# Patient Record
Sex: Female | Born: 1955
Health system: Southern US, Community
[De-identification: ages and names within clinical notes are randomized; demographics above are authoritative.]

## PROBLEM LIST (undated history)

## (undated) DIAGNOSIS — M810 Age-related osteoporosis without current pathological fracture: Secondary | ICD-10-CM

## (undated) DIAGNOSIS — G809 Cerebral palsy, unspecified: Secondary | ICD-10-CM

## (undated) DIAGNOSIS — R1310 Dysphagia, unspecified: Secondary | ICD-10-CM

## (undated) DIAGNOSIS — E785 Hyperlipidemia, unspecified: Secondary | ICD-10-CM

## (undated) HISTORY — PX: BREAST BIOPSY: SHX20

## (undated) HISTORY — DX: Dysphagia, unspecified: R13.10

## (undated) HISTORY — PX: WISDOM TOOTH EXTRACTION: SHX21

## (undated) HISTORY — PX: HIP SURGERY: SHX245

## (undated) HISTORY — DX: Cerebral palsy, unspecified: G80.9

## (undated) HISTORY — DX: Hyperlipidemia, unspecified: E78.5

## (undated) HISTORY — PX: MASTECTOMY: SHX3

## (undated) HISTORY — DX: Age-related osteoporosis without current pathological fracture: M81.0

## (undated) HISTORY — PX: SENTINEL LYMPH NODE BIOPSY: SHX2392

## (undated) HISTORY — PX: COLON RESECTION: SHX5231

---

## 1999-10-08 ENCOUNTER — Encounter: Admission: RE | Admit: 1999-10-08 | Discharge: 1999-10-08 | Payer: Self-pay | Admitting: Family Medicine

## 1999-10-08 ENCOUNTER — Encounter: Payer: Self-pay | Admitting: Family Medicine

## 1999-10-18 ENCOUNTER — Encounter: Payer: Self-pay | Admitting: Family Medicine

## 1999-10-18 ENCOUNTER — Encounter: Admission: RE | Admit: 1999-10-18 | Discharge: 1999-10-18 | Payer: Self-pay | Admitting: Family Medicine

## 2001-06-04 ENCOUNTER — Encounter: Payer: Self-pay | Admitting: Family Medicine

## 2001-06-04 ENCOUNTER — Encounter: Admission: RE | Admit: 2001-06-04 | Discharge: 2001-06-04 | Payer: Self-pay | Admitting: Family Medicine

## 2002-09-08 ENCOUNTER — Encounter: Admission: RE | Admit: 2002-09-08 | Discharge: 2002-09-08 | Payer: Self-pay | Admitting: Family Medicine

## 2002-09-08 ENCOUNTER — Encounter: Payer: Self-pay | Admitting: Family Medicine

## 2003-10-23 ENCOUNTER — Encounter: Admission: RE | Admit: 2003-10-23 | Discharge: 2003-10-23 | Payer: Self-pay | Admitting: Family Medicine

## 2005-02-13 ENCOUNTER — Encounter: Admission: RE | Admit: 2005-02-13 | Discharge: 2005-02-13 | Payer: Self-pay | Admitting: Family Medicine

## 2006-03-05 ENCOUNTER — Encounter: Admission: RE | Admit: 2006-03-05 | Discharge: 2006-03-05 | Payer: Self-pay | Admitting: Family Medicine

## 2007-03-24 ENCOUNTER — Other Ambulatory Visit: Admission: RE | Admit: 2007-03-24 | Discharge: 2007-03-24 | Payer: Self-pay | Admitting: Obstetrics and Gynecology

## 2007-03-25 ENCOUNTER — Encounter: Admission: RE | Admit: 2007-03-25 | Discharge: 2007-03-25 | Payer: Self-pay | Admitting: Family Medicine

## 2008-07-13 ENCOUNTER — Encounter: Admission: RE | Admit: 2008-07-13 | Discharge: 2008-07-13 | Payer: Self-pay | Admitting: Family Medicine

## 2009-08-24 ENCOUNTER — Encounter: Admission: RE | Admit: 2009-08-24 | Discharge: 2009-08-24 | Payer: Self-pay | Admitting: Internal Medicine

## 2009-10-15 ENCOUNTER — Encounter: Admission: RE | Admit: 2009-10-15 | Discharge: 2009-10-15 | Payer: Self-pay | Admitting: Family Medicine

## 2009-11-13 ENCOUNTER — Other Ambulatory Visit
Admission: RE | Admit: 2009-11-13 | Discharge: 2009-11-13 | Payer: Self-pay | Source: Home / Self Care | Admitting: Obstetrics and Gynecology

## 2010-09-11 ENCOUNTER — Other Ambulatory Visit: Payer: Self-pay | Admitting: Internal Medicine

## 2010-09-11 DIAGNOSIS — Z1231 Encounter for screening mammogram for malignant neoplasm of breast: Secondary | ICD-10-CM

## 2010-10-17 ENCOUNTER — Ambulatory Visit: Payer: Self-pay

## 2010-10-31 ENCOUNTER — Ambulatory Visit
Admission: RE | Admit: 2010-10-31 | Discharge: 2010-10-31 | Disposition: A | Discharge status: Home / Self Care | Payer: Medicare Other | Source: Ambulatory Visit | Attending: Internal Medicine | Admitting: Internal Medicine

## 2010-10-31 DIAGNOSIS — Z1231 Encounter for screening mammogram for malignant neoplasm of breast: Secondary | ICD-10-CM

## 2011-07-25 DIAGNOSIS — G809 Cerebral palsy, unspecified: Secondary | ICD-10-CM | POA: Diagnosis not present

## 2011-07-25 DIAGNOSIS — Z0000 Encounter for general adult medical examination without abnormal findings: Secondary | ICD-10-CM | POA: Diagnosis not present

## 2011-07-25 DIAGNOSIS — R030 Elevated blood-pressure reading, without diagnosis of hypertension: Secondary | ICD-10-CM | POA: Diagnosis not present

## 2011-07-25 DIAGNOSIS — E782 Mixed hyperlipidemia: Secondary | ICD-10-CM | POA: Diagnosis not present

## 2011-10-10 DIAGNOSIS — Z23 Encounter for immunization: Secondary | ICD-10-CM | POA: Diagnosis not present

## 2012-01-05 ENCOUNTER — Other Ambulatory Visit: Payer: Self-pay | Admitting: Internal Medicine

## 2012-01-05 DIAGNOSIS — Z1231 Encounter for screening mammogram for malignant neoplasm of breast: Secondary | ICD-10-CM

## 2012-02-09 ENCOUNTER — Ambulatory Visit: Payer: Medicare Other

## 2012-03-11 ENCOUNTER — Ambulatory Visit: Payer: Medicare Other

## 2012-04-14 ENCOUNTER — Ambulatory Visit: Payer: Medicare Other

## 2012-04-15 ENCOUNTER — Ambulatory Visit
Admission: RE | Admit: 2012-04-15 | Discharge: 2012-04-15 | Disposition: A | Discharge status: Home / Self Care | Payer: Medicare Other | Source: Ambulatory Visit | Attending: Internal Medicine | Admitting: Internal Medicine

## 2012-04-15 DIAGNOSIS — Z1231 Encounter for screening mammogram for malignant neoplasm of breast: Secondary | ICD-10-CM

## 2012-05-17 DIAGNOSIS — M79609 Pain in unspecified limb: Secondary | ICD-10-CM | POA: Diagnosis not present

## 2012-05-17 DIAGNOSIS — H6120 Impacted cerumen, unspecified ear: Secondary | ICD-10-CM | POA: Diagnosis not present

## 2012-05-17 DIAGNOSIS — IMO0002 Reserved for concepts with insufficient information to code with codable children: Secondary | ICD-10-CM | POA: Diagnosis not present

## 2012-05-18 DIAGNOSIS — IMO0002 Reserved for concepts with insufficient information to code with codable children: Secondary | ICD-10-CM | POA: Diagnosis not present

## 2012-05-19 DIAGNOSIS — Y939 Activity, unspecified: Secondary | ICD-10-CM | POA: Diagnosis not present

## 2012-05-19 DIAGNOSIS — Y929 Unspecified place or not applicable: Secondary | ICD-10-CM | POA: Diagnosis not present

## 2012-05-19 DIAGNOSIS — Y999 Unspecified external cause status: Secondary | ICD-10-CM | POA: Diagnosis not present

## 2012-05-19 DIAGNOSIS — X58XXXA Exposure to other specified factors, initial encounter: Secondary | ICD-10-CM | POA: Diagnosis not present

## 2012-05-19 DIAGNOSIS — IMO0002 Reserved for concepts with insufficient information to code with codable children: Secondary | ICD-10-CM | POA: Diagnosis not present

## 2012-05-27 DIAGNOSIS — IMO0002 Reserved for concepts with insufficient information to code with codable children: Secondary | ICD-10-CM | POA: Diagnosis not present

## 2012-06-10 DIAGNOSIS — IMO0002 Reserved for concepts with insufficient information to code with codable children: Secondary | ICD-10-CM | POA: Diagnosis not present

## 2012-07-08 DIAGNOSIS — IMO0002 Reserved for concepts with insufficient information to code with codable children: Secondary | ICD-10-CM | POA: Diagnosis not present

## 2012-07-14 DIAGNOSIS — T8489XA Other specified complication of internal orthopedic prosthetic devices, implants and grafts, initial encounter: Secondary | ICD-10-CM | POA: Diagnosis not present

## 2012-07-14 DIAGNOSIS — IMO0002 Reserved for concepts with insufficient information to code with codable children: Secondary | ICD-10-CM | POA: Diagnosis not present

## 2012-07-14 DIAGNOSIS — Y831 Surgical operation with implant of artificial internal device as the cause of abnormal reaction of the patient, or of later complication, without mention of misadventure at the time of the procedure: Secondary | ICD-10-CM | POA: Diagnosis not present

## 2012-10-15 DIAGNOSIS — Z23 Encounter for immunization: Secondary | ICD-10-CM | POA: Diagnosis not present

## 2013-04-21 DIAGNOSIS — E782 Mixed hyperlipidemia: Secondary | ICD-10-CM | POA: Diagnosis not present

## 2013-04-21 DIAGNOSIS — G809 Cerebral palsy, unspecified: Secondary | ICD-10-CM | POA: Diagnosis not present

## 2013-04-21 DIAGNOSIS — Z1211 Encounter for screening for malignant neoplasm of colon: Secondary | ICD-10-CM | POA: Diagnosis not present

## 2013-04-21 DIAGNOSIS — Z0000 Encounter for general adult medical examination without abnormal findings: Secondary | ICD-10-CM | POA: Diagnosis not present

## 2013-07-04 ENCOUNTER — Other Ambulatory Visit: Payer: Self-pay

## 2013-07-04 DIAGNOSIS — Z1231 Encounter for screening mammogram for malignant neoplasm of breast: Secondary | ICD-10-CM

## 2013-07-14 ENCOUNTER — Ambulatory Visit
Admission: RE | Admit: 2013-07-14 | Discharge: 2013-07-14 | Disposition: A | Discharge status: Home / Self Care | Payer: Medicare Other | Source: Ambulatory Visit

## 2013-07-14 DIAGNOSIS — Z1231 Encounter for screening mammogram for malignant neoplasm of breast: Secondary | ICD-10-CM

## 2013-10-26 DIAGNOSIS — Z23 Encounter for immunization: Secondary | ICD-10-CM | POA: Diagnosis not present

## 2014-06-19 DIAGNOSIS — E782 Mixed hyperlipidemia: Secondary | ICD-10-CM | POA: Diagnosis not present

## 2014-06-19 DIAGNOSIS — G809 Cerebral palsy, unspecified: Secondary | ICD-10-CM | POA: Diagnosis not present

## 2014-06-19 DIAGNOSIS — R030 Elevated blood-pressure reading, without diagnosis of hypertension: Secondary | ICD-10-CM | POA: Diagnosis not present

## 2014-06-19 DIAGNOSIS — Z0001 Encounter for general adult medical examination with abnormal findings: Secondary | ICD-10-CM | POA: Diagnosis not present

## 2014-07-17 DIAGNOSIS — R05 Cough: Secondary | ICD-10-CM | POA: Diagnosis not present

## 2014-07-17 DIAGNOSIS — J329 Chronic sinusitis, unspecified: Secondary | ICD-10-CM | POA: Diagnosis not present

## 2014-09-17 DIAGNOSIS — Z23 Encounter for immunization: Secondary | ICD-10-CM | POA: Diagnosis not present

## 2015-08-16 DIAGNOSIS — Z0000 Encounter for general adult medical examination without abnormal findings: Secondary | ICD-10-CM | POA: Diagnosis not present

## 2015-08-16 DIAGNOSIS — E782 Mixed hyperlipidemia: Secondary | ICD-10-CM | POA: Diagnosis not present

## 2015-08-16 DIAGNOSIS — G809 Cerebral palsy, unspecified: Secondary | ICD-10-CM | POA: Diagnosis not present

## 2015-08-16 DIAGNOSIS — R7301 Impaired fasting glucose: Secondary | ICD-10-CM | POA: Diagnosis not present

## 2015-08-22 ENCOUNTER — Other Ambulatory Visit: Payer: Self-pay | Admitting: Family Medicine

## 2015-08-22 DIAGNOSIS — Z1231 Encounter for screening mammogram for malignant neoplasm of breast: Secondary | ICD-10-CM

## 2015-09-06 DIAGNOSIS — M810 Age-related osteoporosis without current pathological fracture: Secondary | ICD-10-CM | POA: Diagnosis not present

## 2015-09-06 DIAGNOSIS — Z780 Asymptomatic menopausal state: Secondary | ICD-10-CM | POA: Diagnosis not present

## 2015-09-06 DIAGNOSIS — Z13820 Encounter for screening for osteoporosis: Secondary | ICD-10-CM | POA: Diagnosis not present

## 2015-09-28 DIAGNOSIS — Z23 Encounter for immunization: Secondary | ICD-10-CM | POA: Diagnosis not present

## 2015-10-04 ENCOUNTER — Ambulatory Visit
Admission: RE | Admit: 2015-10-04 | Discharge: 2015-10-04 | Disposition: A | Discharge status: Home / Self Care | Payer: Medicare Other | Source: Ambulatory Visit | Attending: Family Medicine | Admitting: Family Medicine

## 2015-10-04 DIAGNOSIS — Z1231 Encounter for screening mammogram for malignant neoplasm of breast: Secondary | ICD-10-CM | POA: Diagnosis not present

## 2015-10-04 DIAGNOSIS — M810 Age-related osteoporosis without current pathological fracture: Secondary | ICD-10-CM | POA: Diagnosis not present

## 2016-02-26 DIAGNOSIS — R69 Illness, unspecified: Secondary | ICD-10-CM | POA: Diagnosis not present

## 2016-04-03 DIAGNOSIS — M810 Age-related osteoporosis without current pathological fracture: Secondary | ICD-10-CM | POA: Diagnosis not present

## 2016-05-02 DIAGNOSIS — R32 Unspecified urinary incontinence: Secondary | ICD-10-CM | POA: Diagnosis not present

## 2016-05-02 DIAGNOSIS — Z0000 Encounter for general adult medical examination without abnormal findings: Secondary | ICD-10-CM | POA: Diagnosis not present

## 2016-05-02 DIAGNOSIS — G809 Cerebral palsy, unspecified: Secondary | ICD-10-CM | POA: Diagnosis not present

## 2016-05-02 DIAGNOSIS — M810 Age-related osteoporosis without current pathological fracture: Secondary | ICD-10-CM | POA: Diagnosis not present

## 2016-05-02 DIAGNOSIS — Z6826 Body mass index (BMI) 26.0-26.9, adult: Secondary | ICD-10-CM | POA: Diagnosis not present

## 2016-05-02 DIAGNOSIS — E785 Hyperlipidemia, unspecified: Secondary | ICD-10-CM | POA: Diagnosis not present

## 2016-05-02 DIAGNOSIS — Z79899 Other long term (current) drug therapy: Secondary | ICD-10-CM | POA: Diagnosis not present

## 2016-05-02 DIAGNOSIS — G3184 Mild cognitive impairment, so stated: Secondary | ICD-10-CM | POA: Diagnosis not present

## 2016-06-10 DIAGNOSIS — H6123 Impacted cerumen, bilateral: Secondary | ICD-10-CM | POA: Diagnosis not present

## 2016-08-06 DIAGNOSIS — R69 Illness, unspecified: Secondary | ICD-10-CM | POA: Diagnosis not present

## 2016-09-11 DIAGNOSIS — R7301 Impaired fasting glucose: Secondary | ICD-10-CM | POA: Diagnosis not present

## 2016-09-11 DIAGNOSIS — M810 Age-related osteoporosis without current pathological fracture: Secondary | ICD-10-CM | POA: Diagnosis not present

## 2016-09-11 DIAGNOSIS — G809 Cerebral palsy, unspecified: Secondary | ICD-10-CM | POA: Diagnosis not present

## 2016-09-11 DIAGNOSIS — Z23 Encounter for immunization: Secondary | ICD-10-CM | POA: Diagnosis not present

## 2016-09-11 DIAGNOSIS — Z0000 Encounter for general adult medical examination without abnormal findings: Secondary | ICD-10-CM | POA: Diagnosis not present

## 2016-09-11 DIAGNOSIS — E782 Mixed hyperlipidemia: Secondary | ICD-10-CM | POA: Diagnosis not present

## 2016-10-09 ENCOUNTER — Other Ambulatory Visit: Payer: Self-pay | Admitting: Family Medicine

## 2016-10-09 ENCOUNTER — Ambulatory Visit
Admission: RE | Admit: 2016-10-09 | Discharge: 2016-10-09 | Disposition: A | Discharge status: Home / Self Care | Payer: Medicare HMO | Source: Ambulatory Visit | Attending: Family Medicine | Admitting: Family Medicine

## 2016-10-09 DIAGNOSIS — M810 Age-related osteoporosis without current pathological fracture: Secondary | ICD-10-CM | POA: Diagnosis not present

## 2016-10-09 DIAGNOSIS — R1310 Dysphagia, unspecified: Secondary | ICD-10-CM | POA: Diagnosis not present

## 2016-10-09 DIAGNOSIS — R05 Cough: Secondary | ICD-10-CM

## 2016-10-09 DIAGNOSIS — R059 Cough, unspecified: Secondary | ICD-10-CM

## 2016-10-09 DIAGNOSIS — J309 Allergic rhinitis, unspecified: Secondary | ICD-10-CM | POA: Diagnosis not present

## 2017-04-16 DIAGNOSIS — M810 Age-related osteoporosis without current pathological fracture: Secondary | ICD-10-CM | POA: Diagnosis not present

## 2017-05-05 DIAGNOSIS — M810 Age-related osteoporosis without current pathological fracture: Secondary | ICD-10-CM | POA: Diagnosis not present

## 2017-05-05 DIAGNOSIS — E785 Hyperlipidemia, unspecified: Secondary | ICD-10-CM | POA: Diagnosis not present

## 2017-05-05 DIAGNOSIS — G3184 Mild cognitive impairment, so stated: Secondary | ICD-10-CM | POA: Diagnosis not present

## 2017-05-05 DIAGNOSIS — Z7982 Long term (current) use of aspirin: Secondary | ICD-10-CM | POA: Diagnosis not present

## 2017-05-05 DIAGNOSIS — J309 Allergic rhinitis, unspecified: Secondary | ICD-10-CM | POA: Diagnosis not present

## 2017-05-05 DIAGNOSIS — R32 Unspecified urinary incontinence: Secondary | ICD-10-CM | POA: Diagnosis not present

## 2017-05-05 DIAGNOSIS — K219 Gastro-esophageal reflux disease without esophagitis: Secondary | ICD-10-CM | POA: Diagnosis not present

## 2017-05-05 DIAGNOSIS — G809 Cerebral palsy, unspecified: Secondary | ICD-10-CM | POA: Diagnosis not present

## 2017-05-05 DIAGNOSIS — Z9181 History of falling: Secondary | ICD-10-CM | POA: Diagnosis not present

## 2017-06-15 DIAGNOSIS — R319 Hematuria, unspecified: Secondary | ICD-10-CM | POA: Diagnosis not present

## 2017-09-14 ENCOUNTER — Other Ambulatory Visit: Payer: Self-pay | Admitting: Family Medicine

## 2017-09-14 DIAGNOSIS — Z1231 Encounter for screening mammogram for malignant neoplasm of breast: Secondary | ICD-10-CM

## 2017-09-25 DIAGNOSIS — R69 Illness, unspecified: Secondary | ICD-10-CM | POA: Diagnosis not present

## 2017-10-01 DIAGNOSIS — G809 Cerebral palsy, unspecified: Secondary | ICD-10-CM | POA: Diagnosis not present

## 2017-10-01 DIAGNOSIS — E782 Mixed hyperlipidemia: Secondary | ICD-10-CM | POA: Diagnosis not present

## 2017-10-01 DIAGNOSIS — E669 Obesity, unspecified: Secondary | ICD-10-CM | POA: Diagnosis not present

## 2017-10-01 DIAGNOSIS — Z0000 Encounter for general adult medical examination without abnormal findings: Secondary | ICD-10-CM | POA: Diagnosis not present

## 2017-10-01 DIAGNOSIS — Z23 Encounter for immunization: Secondary | ICD-10-CM | POA: Diagnosis not present

## 2017-10-01 DIAGNOSIS — R7301 Impaired fasting glucose: Secondary | ICD-10-CM | POA: Diagnosis not present

## 2017-10-01 DIAGNOSIS — J309 Allergic rhinitis, unspecified: Secondary | ICD-10-CM | POA: Diagnosis not present

## 2017-10-01 DIAGNOSIS — M810 Age-related osteoporosis without current pathological fracture: Secondary | ICD-10-CM | POA: Diagnosis not present

## 2017-10-01 DIAGNOSIS — Z6831 Body mass index (BMI) 31.0-31.9, adult: Secondary | ICD-10-CM | POA: Diagnosis not present

## 2017-10-22 ENCOUNTER — Encounter: Payer: Self-pay | Admitting: Radiology

## 2017-10-22 ENCOUNTER — Ambulatory Visit
Admission: RE | Admit: 2017-10-22 | Discharge: 2017-10-22 | Disposition: A | Discharge status: Home / Self Care | Payer: Medicare HMO | Source: Ambulatory Visit | Attending: Family Medicine | Admitting: Family Medicine

## 2017-10-22 DIAGNOSIS — M810 Age-related osteoporosis without current pathological fracture: Secondary | ICD-10-CM | POA: Diagnosis not present

## 2017-10-22 DIAGNOSIS — Z1231 Encounter for screening mammogram for malignant neoplasm of breast: Secondary | ICD-10-CM | POA: Diagnosis not present

## 2017-11-19 DIAGNOSIS — M810 Age-related osteoporosis without current pathological fracture: Secondary | ICD-10-CM | POA: Diagnosis not present

## 2017-11-19 DIAGNOSIS — M8588 Other specified disorders of bone density and structure, other site: Secondary | ICD-10-CM | POA: Diagnosis not present

## 2018-02-08 DIAGNOSIS — R000 Tachycardia, unspecified: Secondary | ICD-10-CM | POA: Diagnosis not present

## 2018-02-08 DIAGNOSIS — G809 Cerebral palsy, unspecified: Secondary | ICD-10-CM | POA: Diagnosis not present

## 2018-02-08 DIAGNOSIS — R296 Repeated falls: Secondary | ICD-10-CM | POA: Diagnosis not present

## 2018-02-11 DIAGNOSIS — R296 Repeated falls: Secondary | ICD-10-CM | POA: Diagnosis not present

## 2018-02-11 DIAGNOSIS — M810 Age-related osteoporosis without current pathological fracture: Secondary | ICD-10-CM | POA: Diagnosis not present

## 2018-02-11 DIAGNOSIS — Z9181 History of falling: Secondary | ICD-10-CM | POA: Diagnosis not present

## 2018-02-11 DIAGNOSIS — E782 Mixed hyperlipidemia: Secondary | ICD-10-CM | POA: Diagnosis not present

## 2018-02-11 DIAGNOSIS — Z6834 Body mass index (BMI) 34.0-34.9, adult: Secondary | ICD-10-CM | POA: Diagnosis not present

## 2018-02-11 DIAGNOSIS — R1310 Dysphagia, unspecified: Secondary | ICD-10-CM | POA: Diagnosis not present

## 2018-02-11 DIAGNOSIS — J309 Allergic rhinitis, unspecified: Secondary | ICD-10-CM | POA: Diagnosis not present

## 2018-02-11 DIAGNOSIS — G809 Cerebral palsy, unspecified: Secondary | ICD-10-CM | POA: Diagnosis not present

## 2018-02-11 DIAGNOSIS — E785 Hyperlipidemia, unspecified: Secondary | ICD-10-CM | POA: Diagnosis not present

## 2018-02-11 DIAGNOSIS — E669 Obesity, unspecified: Secondary | ICD-10-CM | POA: Diagnosis not present

## 2018-02-16 DIAGNOSIS — G809 Cerebral palsy, unspecified: Secondary | ICD-10-CM | POA: Diagnosis not present

## 2018-02-16 DIAGNOSIS — E669 Obesity, unspecified: Secondary | ICD-10-CM | POA: Diagnosis not present

## 2018-02-16 DIAGNOSIS — J309 Allergic rhinitis, unspecified: Secondary | ICD-10-CM | POA: Diagnosis not present

## 2018-02-16 DIAGNOSIS — R1310 Dysphagia, unspecified: Secondary | ICD-10-CM | POA: Diagnosis not present

## 2018-02-16 DIAGNOSIS — M810 Age-related osteoporosis without current pathological fracture: Secondary | ICD-10-CM | POA: Diagnosis not present

## 2018-02-16 DIAGNOSIS — E782 Mixed hyperlipidemia: Secondary | ICD-10-CM | POA: Diagnosis not present

## 2018-02-16 DIAGNOSIS — Z6834 Body mass index (BMI) 34.0-34.9, adult: Secondary | ICD-10-CM | POA: Diagnosis not present

## 2018-02-16 DIAGNOSIS — Z9181 History of falling: Secondary | ICD-10-CM | POA: Diagnosis not present

## 2018-02-16 DIAGNOSIS — E785 Hyperlipidemia, unspecified: Secondary | ICD-10-CM | POA: Diagnosis not present

## 2018-02-16 DIAGNOSIS — R296 Repeated falls: Secondary | ICD-10-CM | POA: Diagnosis not present

## 2018-02-18 DIAGNOSIS — G809 Cerebral palsy, unspecified: Secondary | ICD-10-CM | POA: Diagnosis not present

## 2018-02-18 DIAGNOSIS — Z6834 Body mass index (BMI) 34.0-34.9, adult: Secondary | ICD-10-CM | POA: Diagnosis not present

## 2018-02-18 DIAGNOSIS — E782 Mixed hyperlipidemia: Secondary | ICD-10-CM | POA: Diagnosis not present

## 2018-02-18 DIAGNOSIS — R296 Repeated falls: Secondary | ICD-10-CM | POA: Diagnosis not present

## 2018-02-18 DIAGNOSIS — J309 Allergic rhinitis, unspecified: Secondary | ICD-10-CM | POA: Diagnosis not present

## 2018-02-18 DIAGNOSIS — R1310 Dysphagia, unspecified: Secondary | ICD-10-CM | POA: Diagnosis not present

## 2018-02-18 DIAGNOSIS — E669 Obesity, unspecified: Secondary | ICD-10-CM | POA: Diagnosis not present

## 2018-02-18 DIAGNOSIS — Z9181 History of falling: Secondary | ICD-10-CM | POA: Diagnosis not present

## 2018-02-18 DIAGNOSIS — M810 Age-related osteoporosis without current pathological fracture: Secondary | ICD-10-CM | POA: Diagnosis not present

## 2018-02-18 DIAGNOSIS — E785 Hyperlipidemia, unspecified: Secondary | ICD-10-CM | POA: Diagnosis not present

## 2018-02-19 DIAGNOSIS — M810 Age-related osteoporosis without current pathological fracture: Secondary | ICD-10-CM | POA: Diagnosis not present

## 2018-02-19 DIAGNOSIS — Z9181 History of falling: Secondary | ICD-10-CM | POA: Diagnosis not present

## 2018-02-19 DIAGNOSIS — R296 Repeated falls: Secondary | ICD-10-CM | POA: Diagnosis not present

## 2018-02-19 DIAGNOSIS — E782 Mixed hyperlipidemia: Secondary | ICD-10-CM | POA: Diagnosis not present

## 2018-02-19 DIAGNOSIS — E669 Obesity, unspecified: Secondary | ICD-10-CM | POA: Diagnosis not present

## 2018-02-19 DIAGNOSIS — J309 Allergic rhinitis, unspecified: Secondary | ICD-10-CM | POA: Diagnosis not present

## 2018-02-19 DIAGNOSIS — E785 Hyperlipidemia, unspecified: Secondary | ICD-10-CM | POA: Diagnosis not present

## 2018-02-19 DIAGNOSIS — G809 Cerebral palsy, unspecified: Secondary | ICD-10-CM | POA: Diagnosis not present

## 2018-02-19 DIAGNOSIS — R1310 Dysphagia, unspecified: Secondary | ICD-10-CM | POA: Diagnosis not present

## 2018-02-19 DIAGNOSIS — Z6834 Body mass index (BMI) 34.0-34.9, adult: Secondary | ICD-10-CM | POA: Diagnosis not present

## 2018-02-23 DIAGNOSIS — E785 Hyperlipidemia, unspecified: Secondary | ICD-10-CM | POA: Diagnosis not present

## 2018-02-23 DIAGNOSIS — M810 Age-related osteoporosis without current pathological fracture: Secondary | ICD-10-CM | POA: Diagnosis not present

## 2018-02-23 DIAGNOSIS — G809 Cerebral palsy, unspecified: Secondary | ICD-10-CM | POA: Diagnosis not present

## 2018-02-23 DIAGNOSIS — Z6834 Body mass index (BMI) 34.0-34.9, adult: Secondary | ICD-10-CM | POA: Diagnosis not present

## 2018-02-23 DIAGNOSIS — E782 Mixed hyperlipidemia: Secondary | ICD-10-CM | POA: Diagnosis not present

## 2018-02-23 DIAGNOSIS — R296 Repeated falls: Secondary | ICD-10-CM | POA: Diagnosis not present

## 2018-02-23 DIAGNOSIS — R1310 Dysphagia, unspecified: Secondary | ICD-10-CM | POA: Diagnosis not present

## 2018-02-23 DIAGNOSIS — J309 Allergic rhinitis, unspecified: Secondary | ICD-10-CM | POA: Diagnosis not present

## 2018-02-23 DIAGNOSIS — Z9181 History of falling: Secondary | ICD-10-CM | POA: Diagnosis not present

## 2018-02-23 DIAGNOSIS — E669 Obesity, unspecified: Secondary | ICD-10-CM | POA: Diagnosis not present

## 2018-02-25 DIAGNOSIS — R296 Repeated falls: Secondary | ICD-10-CM | POA: Diagnosis not present

## 2018-02-25 DIAGNOSIS — M810 Age-related osteoporosis without current pathological fracture: Secondary | ICD-10-CM | POA: Diagnosis not present

## 2018-02-25 DIAGNOSIS — G809 Cerebral palsy, unspecified: Secondary | ICD-10-CM | POA: Diagnosis not present

## 2018-02-25 DIAGNOSIS — E782 Mixed hyperlipidemia: Secondary | ICD-10-CM | POA: Diagnosis not present

## 2018-02-25 DIAGNOSIS — Z9181 History of falling: Secondary | ICD-10-CM | POA: Diagnosis not present

## 2018-02-25 DIAGNOSIS — Z6834 Body mass index (BMI) 34.0-34.9, adult: Secondary | ICD-10-CM | POA: Diagnosis not present

## 2018-02-25 DIAGNOSIS — E669 Obesity, unspecified: Secondary | ICD-10-CM | POA: Diagnosis not present

## 2018-02-25 DIAGNOSIS — J309 Allergic rhinitis, unspecified: Secondary | ICD-10-CM | POA: Diagnosis not present

## 2018-02-25 DIAGNOSIS — E785 Hyperlipidemia, unspecified: Secondary | ICD-10-CM | POA: Diagnosis not present

## 2018-02-25 DIAGNOSIS — R1310 Dysphagia, unspecified: Secondary | ICD-10-CM | POA: Diagnosis not present

## 2018-03-02 DIAGNOSIS — Z6834 Body mass index (BMI) 34.0-34.9, adult: Secondary | ICD-10-CM | POA: Diagnosis not present

## 2018-03-02 DIAGNOSIS — J309 Allergic rhinitis, unspecified: Secondary | ICD-10-CM | POA: Diagnosis not present

## 2018-03-02 DIAGNOSIS — G809 Cerebral palsy, unspecified: Secondary | ICD-10-CM | POA: Diagnosis not present

## 2018-03-02 DIAGNOSIS — Z9181 History of falling: Secondary | ICD-10-CM | POA: Diagnosis not present

## 2018-03-02 DIAGNOSIS — E785 Hyperlipidemia, unspecified: Secondary | ICD-10-CM | POA: Diagnosis not present

## 2018-03-02 DIAGNOSIS — M810 Age-related osteoporosis without current pathological fracture: Secondary | ICD-10-CM | POA: Diagnosis not present

## 2018-03-02 DIAGNOSIS — E669 Obesity, unspecified: Secondary | ICD-10-CM | POA: Diagnosis not present

## 2018-03-02 DIAGNOSIS — R296 Repeated falls: Secondary | ICD-10-CM | POA: Diagnosis not present

## 2018-03-02 DIAGNOSIS — E782 Mixed hyperlipidemia: Secondary | ICD-10-CM | POA: Diagnosis not present

## 2018-03-02 DIAGNOSIS — R1310 Dysphagia, unspecified: Secondary | ICD-10-CM | POA: Diagnosis not present

## 2018-03-04 DIAGNOSIS — J309 Allergic rhinitis, unspecified: Secondary | ICD-10-CM | POA: Diagnosis not present

## 2018-03-04 DIAGNOSIS — Z6834 Body mass index (BMI) 34.0-34.9, adult: Secondary | ICD-10-CM | POA: Diagnosis not present

## 2018-03-04 DIAGNOSIS — R1310 Dysphagia, unspecified: Secondary | ICD-10-CM | POA: Diagnosis not present

## 2018-03-04 DIAGNOSIS — R296 Repeated falls: Secondary | ICD-10-CM | POA: Diagnosis not present

## 2018-03-04 DIAGNOSIS — G809 Cerebral palsy, unspecified: Secondary | ICD-10-CM | POA: Diagnosis not present

## 2018-03-04 DIAGNOSIS — E669 Obesity, unspecified: Secondary | ICD-10-CM | POA: Diagnosis not present

## 2018-03-04 DIAGNOSIS — M810 Age-related osteoporosis without current pathological fracture: Secondary | ICD-10-CM | POA: Diagnosis not present

## 2018-03-04 DIAGNOSIS — E782 Mixed hyperlipidemia: Secondary | ICD-10-CM | POA: Diagnosis not present

## 2018-03-04 DIAGNOSIS — E785 Hyperlipidemia, unspecified: Secondary | ICD-10-CM | POA: Diagnosis not present

## 2018-03-04 DIAGNOSIS — Z9181 History of falling: Secondary | ICD-10-CM | POA: Diagnosis not present

## 2018-03-05 DIAGNOSIS — J309 Allergic rhinitis, unspecified: Secondary | ICD-10-CM | POA: Diagnosis not present

## 2018-03-05 DIAGNOSIS — R1310 Dysphagia, unspecified: Secondary | ICD-10-CM | POA: Diagnosis not present

## 2018-03-05 DIAGNOSIS — R296 Repeated falls: Secondary | ICD-10-CM | POA: Diagnosis not present

## 2018-03-05 DIAGNOSIS — M810 Age-related osteoporosis without current pathological fracture: Secondary | ICD-10-CM | POA: Diagnosis not present

## 2018-03-05 DIAGNOSIS — G809 Cerebral palsy, unspecified: Secondary | ICD-10-CM | POA: Diagnosis not present

## 2018-03-05 DIAGNOSIS — E669 Obesity, unspecified: Secondary | ICD-10-CM | POA: Diagnosis not present

## 2018-03-05 DIAGNOSIS — Z9181 History of falling: Secondary | ICD-10-CM | POA: Diagnosis not present

## 2018-03-05 DIAGNOSIS — E785 Hyperlipidemia, unspecified: Secondary | ICD-10-CM | POA: Diagnosis not present

## 2018-03-05 DIAGNOSIS — Z6834 Body mass index (BMI) 34.0-34.9, adult: Secondary | ICD-10-CM | POA: Diagnosis not present

## 2018-03-05 DIAGNOSIS — E782 Mixed hyperlipidemia: Secondary | ICD-10-CM | POA: Diagnosis not present

## 2018-03-09 DIAGNOSIS — J309 Allergic rhinitis, unspecified: Secondary | ICD-10-CM | POA: Diagnosis not present

## 2018-03-09 DIAGNOSIS — Z6834 Body mass index (BMI) 34.0-34.9, adult: Secondary | ICD-10-CM | POA: Diagnosis not present

## 2018-03-09 DIAGNOSIS — R1310 Dysphagia, unspecified: Secondary | ICD-10-CM | POA: Diagnosis not present

## 2018-03-09 DIAGNOSIS — E785 Hyperlipidemia, unspecified: Secondary | ICD-10-CM | POA: Diagnosis not present

## 2018-03-09 DIAGNOSIS — M810 Age-related osteoporosis without current pathological fracture: Secondary | ICD-10-CM | POA: Diagnosis not present

## 2018-03-09 DIAGNOSIS — E782 Mixed hyperlipidemia: Secondary | ICD-10-CM | POA: Diagnosis not present

## 2018-03-09 DIAGNOSIS — G809 Cerebral palsy, unspecified: Secondary | ICD-10-CM | POA: Diagnosis not present

## 2018-03-09 DIAGNOSIS — E669 Obesity, unspecified: Secondary | ICD-10-CM | POA: Diagnosis not present

## 2018-03-09 DIAGNOSIS — R296 Repeated falls: Secondary | ICD-10-CM | POA: Diagnosis not present

## 2018-03-09 DIAGNOSIS — Z9181 History of falling: Secondary | ICD-10-CM | POA: Diagnosis not present

## 2018-03-12 DIAGNOSIS — J309 Allergic rhinitis, unspecified: Secondary | ICD-10-CM | POA: Diagnosis not present

## 2018-03-12 DIAGNOSIS — M810 Age-related osteoporosis without current pathological fracture: Secondary | ICD-10-CM | POA: Diagnosis not present

## 2018-03-12 DIAGNOSIS — Z9181 History of falling: Secondary | ICD-10-CM | POA: Diagnosis not present

## 2018-03-12 DIAGNOSIS — E669 Obesity, unspecified: Secondary | ICD-10-CM | POA: Diagnosis not present

## 2018-03-12 DIAGNOSIS — R296 Repeated falls: Secondary | ICD-10-CM | POA: Diagnosis not present

## 2018-03-12 DIAGNOSIS — E782 Mixed hyperlipidemia: Secondary | ICD-10-CM | POA: Diagnosis not present

## 2018-03-12 DIAGNOSIS — E785 Hyperlipidemia, unspecified: Secondary | ICD-10-CM | POA: Diagnosis not present

## 2018-03-12 DIAGNOSIS — G809 Cerebral palsy, unspecified: Secondary | ICD-10-CM | POA: Diagnosis not present

## 2018-03-12 DIAGNOSIS — R1310 Dysphagia, unspecified: Secondary | ICD-10-CM | POA: Diagnosis not present

## 2018-03-12 DIAGNOSIS — Z6834 Body mass index (BMI) 34.0-34.9, adult: Secondary | ICD-10-CM | POA: Diagnosis not present

## 2018-03-16 DIAGNOSIS — Z6834 Body mass index (BMI) 34.0-34.9, adult: Secondary | ICD-10-CM | POA: Diagnosis not present

## 2018-03-16 DIAGNOSIS — E785 Hyperlipidemia, unspecified: Secondary | ICD-10-CM | POA: Diagnosis not present

## 2018-03-16 DIAGNOSIS — E782 Mixed hyperlipidemia: Secondary | ICD-10-CM | POA: Diagnosis not present

## 2018-03-16 DIAGNOSIS — G809 Cerebral palsy, unspecified: Secondary | ICD-10-CM | POA: Diagnosis not present

## 2018-03-16 DIAGNOSIS — Z9181 History of falling: Secondary | ICD-10-CM | POA: Diagnosis not present

## 2018-03-16 DIAGNOSIS — R296 Repeated falls: Secondary | ICD-10-CM | POA: Diagnosis not present

## 2018-03-16 DIAGNOSIS — R1310 Dysphagia, unspecified: Secondary | ICD-10-CM | POA: Diagnosis not present

## 2018-03-16 DIAGNOSIS — M810 Age-related osteoporosis without current pathological fracture: Secondary | ICD-10-CM | POA: Diagnosis not present

## 2018-03-16 DIAGNOSIS — E669 Obesity, unspecified: Secondary | ICD-10-CM | POA: Diagnosis not present

## 2018-03-16 DIAGNOSIS — J309 Allergic rhinitis, unspecified: Secondary | ICD-10-CM | POA: Diagnosis not present

## 2018-03-18 DIAGNOSIS — E669 Obesity, unspecified: Secondary | ICD-10-CM | POA: Diagnosis not present

## 2018-03-18 DIAGNOSIS — R1310 Dysphagia, unspecified: Secondary | ICD-10-CM | POA: Diagnosis not present

## 2018-03-18 DIAGNOSIS — Z6834 Body mass index (BMI) 34.0-34.9, adult: Secondary | ICD-10-CM | POA: Diagnosis not present

## 2018-03-18 DIAGNOSIS — G809 Cerebral palsy, unspecified: Secondary | ICD-10-CM | POA: Diagnosis not present

## 2018-03-18 DIAGNOSIS — E782 Mixed hyperlipidemia: Secondary | ICD-10-CM | POA: Diagnosis not present

## 2018-03-18 DIAGNOSIS — E785 Hyperlipidemia, unspecified: Secondary | ICD-10-CM | POA: Diagnosis not present

## 2018-03-18 DIAGNOSIS — J309 Allergic rhinitis, unspecified: Secondary | ICD-10-CM | POA: Diagnosis not present

## 2018-03-18 DIAGNOSIS — R296 Repeated falls: Secondary | ICD-10-CM | POA: Diagnosis not present

## 2018-03-18 DIAGNOSIS — M810 Age-related osteoporosis without current pathological fracture: Secondary | ICD-10-CM | POA: Diagnosis not present

## 2018-03-18 DIAGNOSIS — Z9181 History of falling: Secondary | ICD-10-CM | POA: Diagnosis not present

## 2018-03-19 DIAGNOSIS — J309 Allergic rhinitis, unspecified: Secondary | ICD-10-CM | POA: Diagnosis not present

## 2018-03-19 DIAGNOSIS — R296 Repeated falls: Secondary | ICD-10-CM | POA: Diagnosis not present

## 2018-03-19 DIAGNOSIS — E669 Obesity, unspecified: Secondary | ICD-10-CM | POA: Diagnosis not present

## 2018-03-19 DIAGNOSIS — G809 Cerebral palsy, unspecified: Secondary | ICD-10-CM | POA: Diagnosis not present

## 2018-03-19 DIAGNOSIS — Z6834 Body mass index (BMI) 34.0-34.9, adult: Secondary | ICD-10-CM | POA: Diagnosis not present

## 2018-03-19 DIAGNOSIS — Z9181 History of falling: Secondary | ICD-10-CM | POA: Diagnosis not present

## 2018-03-19 DIAGNOSIS — E785 Hyperlipidemia, unspecified: Secondary | ICD-10-CM | POA: Diagnosis not present

## 2018-03-19 DIAGNOSIS — E782 Mixed hyperlipidemia: Secondary | ICD-10-CM | POA: Diagnosis not present

## 2018-03-19 DIAGNOSIS — R1310 Dysphagia, unspecified: Secondary | ICD-10-CM | POA: Diagnosis not present

## 2018-03-19 DIAGNOSIS — M810 Age-related osteoporosis without current pathological fracture: Secondary | ICD-10-CM | POA: Diagnosis not present

## 2018-03-22 DIAGNOSIS — E669 Obesity, unspecified: Secondary | ICD-10-CM | POA: Diagnosis not present

## 2018-03-22 DIAGNOSIS — M810 Age-related osteoporosis without current pathological fracture: Secondary | ICD-10-CM | POA: Diagnosis not present

## 2018-03-22 DIAGNOSIS — E782 Mixed hyperlipidemia: Secondary | ICD-10-CM | POA: Diagnosis not present

## 2018-03-22 DIAGNOSIS — R1310 Dysphagia, unspecified: Secondary | ICD-10-CM | POA: Diagnosis not present

## 2018-03-22 DIAGNOSIS — R296 Repeated falls: Secondary | ICD-10-CM | POA: Diagnosis not present

## 2018-03-22 DIAGNOSIS — G809 Cerebral palsy, unspecified: Secondary | ICD-10-CM | POA: Diagnosis not present

## 2018-03-22 DIAGNOSIS — Z9181 History of falling: Secondary | ICD-10-CM | POA: Diagnosis not present

## 2018-03-22 DIAGNOSIS — J309 Allergic rhinitis, unspecified: Secondary | ICD-10-CM | POA: Diagnosis not present

## 2018-03-22 DIAGNOSIS — E785 Hyperlipidemia, unspecified: Secondary | ICD-10-CM | POA: Diagnosis not present

## 2018-03-22 DIAGNOSIS — Z6834 Body mass index (BMI) 34.0-34.9, adult: Secondary | ICD-10-CM | POA: Diagnosis not present

## 2018-03-23 DIAGNOSIS — Z9181 History of falling: Secondary | ICD-10-CM | POA: Diagnosis not present

## 2018-03-23 DIAGNOSIS — Z6834 Body mass index (BMI) 34.0-34.9, adult: Secondary | ICD-10-CM | POA: Diagnosis not present

## 2018-03-23 DIAGNOSIS — E782 Mixed hyperlipidemia: Secondary | ICD-10-CM | POA: Diagnosis not present

## 2018-03-23 DIAGNOSIS — E785 Hyperlipidemia, unspecified: Secondary | ICD-10-CM | POA: Diagnosis not present

## 2018-03-23 DIAGNOSIS — G809 Cerebral palsy, unspecified: Secondary | ICD-10-CM | POA: Diagnosis not present

## 2018-03-23 DIAGNOSIS — R1310 Dysphagia, unspecified: Secondary | ICD-10-CM | POA: Diagnosis not present

## 2018-03-23 DIAGNOSIS — M810 Age-related osteoporosis without current pathological fracture: Secondary | ICD-10-CM | POA: Diagnosis not present

## 2018-03-23 DIAGNOSIS — J309 Allergic rhinitis, unspecified: Secondary | ICD-10-CM | POA: Diagnosis not present

## 2018-03-23 DIAGNOSIS — E669 Obesity, unspecified: Secondary | ICD-10-CM | POA: Diagnosis not present

## 2018-03-23 DIAGNOSIS — R296 Repeated falls: Secondary | ICD-10-CM | POA: Diagnosis not present

## 2018-03-24 DIAGNOSIS — E669 Obesity, unspecified: Secondary | ICD-10-CM | POA: Diagnosis not present

## 2018-03-24 DIAGNOSIS — R1310 Dysphagia, unspecified: Secondary | ICD-10-CM | POA: Diagnosis not present

## 2018-03-24 DIAGNOSIS — G809 Cerebral palsy, unspecified: Secondary | ICD-10-CM | POA: Diagnosis not present

## 2018-03-24 DIAGNOSIS — Z9181 History of falling: Secondary | ICD-10-CM | POA: Diagnosis not present

## 2018-03-24 DIAGNOSIS — J309 Allergic rhinitis, unspecified: Secondary | ICD-10-CM | POA: Diagnosis not present

## 2018-03-24 DIAGNOSIS — E782 Mixed hyperlipidemia: Secondary | ICD-10-CM | POA: Diagnosis not present

## 2018-03-24 DIAGNOSIS — M810 Age-related osteoporosis without current pathological fracture: Secondary | ICD-10-CM | POA: Diagnosis not present

## 2018-03-24 DIAGNOSIS — Z6834 Body mass index (BMI) 34.0-34.9, adult: Secondary | ICD-10-CM | POA: Diagnosis not present

## 2018-03-24 DIAGNOSIS — E785 Hyperlipidemia, unspecified: Secondary | ICD-10-CM | POA: Diagnosis not present

## 2018-03-24 DIAGNOSIS — R296 Repeated falls: Secondary | ICD-10-CM | POA: Diagnosis not present

## 2018-03-25 DIAGNOSIS — J309 Allergic rhinitis, unspecified: Secondary | ICD-10-CM | POA: Diagnosis not present

## 2018-03-25 DIAGNOSIS — G809 Cerebral palsy, unspecified: Secondary | ICD-10-CM | POA: Diagnosis not present

## 2018-03-25 DIAGNOSIS — R296 Repeated falls: Secondary | ICD-10-CM | POA: Diagnosis not present

## 2018-03-25 DIAGNOSIS — E785 Hyperlipidemia, unspecified: Secondary | ICD-10-CM | POA: Diagnosis not present

## 2018-03-25 DIAGNOSIS — R1310 Dysphagia, unspecified: Secondary | ICD-10-CM | POA: Diagnosis not present

## 2018-03-25 DIAGNOSIS — M810 Age-related osteoporosis without current pathological fracture: Secondary | ICD-10-CM | POA: Diagnosis not present

## 2018-03-25 DIAGNOSIS — E669 Obesity, unspecified: Secondary | ICD-10-CM | POA: Diagnosis not present

## 2018-03-25 DIAGNOSIS — Z6834 Body mass index (BMI) 34.0-34.9, adult: Secondary | ICD-10-CM | POA: Diagnosis not present

## 2018-03-25 DIAGNOSIS — Z9181 History of falling: Secondary | ICD-10-CM | POA: Diagnosis not present

## 2018-03-25 DIAGNOSIS — E782 Mixed hyperlipidemia: Secondary | ICD-10-CM | POA: Diagnosis not present

## 2018-04-19 DIAGNOSIS — G809 Cerebral palsy, unspecified: Secondary | ICD-10-CM | POA: Diagnosis not present

## 2018-05-19 DIAGNOSIS — G809 Cerebral palsy, unspecified: Secondary | ICD-10-CM | POA: Diagnosis not present

## 2018-06-19 DIAGNOSIS — G809 Cerebral palsy, unspecified: Secondary | ICD-10-CM | POA: Diagnosis not present

## 2018-07-19 DIAGNOSIS — G809 Cerebral palsy, unspecified: Secondary | ICD-10-CM | POA: Diagnosis not present

## 2018-08-19 DIAGNOSIS — G809 Cerebral palsy, unspecified: Secondary | ICD-10-CM | POA: Diagnosis not present

## 2018-09-19 DIAGNOSIS — G809 Cerebral palsy, unspecified: Secondary | ICD-10-CM | POA: Diagnosis not present

## 2018-10-19 DIAGNOSIS — G809 Cerebral palsy, unspecified: Secondary | ICD-10-CM | POA: Diagnosis not present

## 2018-11-02 DIAGNOSIS — Z1211 Encounter for screening for malignant neoplasm of colon: Secondary | ICD-10-CM | POA: Diagnosis not present

## 2018-11-02 DIAGNOSIS — R7301 Impaired fasting glucose: Secondary | ICD-10-CM | POA: Diagnosis not present

## 2018-11-02 DIAGNOSIS — Z0000 Encounter for general adult medical examination without abnormal findings: Secondary | ICD-10-CM | POA: Diagnosis not present

## 2018-11-02 DIAGNOSIS — Z7189 Other specified counseling: Secondary | ICD-10-CM | POA: Diagnosis not present

## 2018-11-02 DIAGNOSIS — J309 Allergic rhinitis, unspecified: Secondary | ICD-10-CM | POA: Diagnosis not present

## 2018-11-02 DIAGNOSIS — G809 Cerebral palsy, unspecified: Secondary | ICD-10-CM | POA: Diagnosis not present

## 2018-11-02 DIAGNOSIS — E669 Obesity, unspecified: Secondary | ICD-10-CM | POA: Diagnosis not present

## 2018-11-02 DIAGNOSIS — M810 Age-related osteoporosis without current pathological fracture: Secondary | ICD-10-CM | POA: Diagnosis not present

## 2018-11-02 DIAGNOSIS — E782 Mixed hyperlipidemia: Secondary | ICD-10-CM | POA: Diagnosis not present

## 2018-11-19 DIAGNOSIS — G809 Cerebral palsy, unspecified: Secondary | ICD-10-CM | POA: Diagnosis not present

## 2019-01-26 DIAGNOSIS — R69 Illness, unspecified: Secondary | ICD-10-CM | POA: Diagnosis not present

## 2019-02-18 DIAGNOSIS — E669 Obesity, unspecified: Secondary | ICD-10-CM | POA: Diagnosis not present

## 2019-02-18 DIAGNOSIS — E785 Hyperlipidemia, unspecified: Secondary | ICD-10-CM | POA: Diagnosis not present

## 2019-02-18 DIAGNOSIS — G3184 Mild cognitive impairment, so stated: Secondary | ICD-10-CM | POA: Diagnosis not present

## 2019-02-18 DIAGNOSIS — R269 Unspecified abnormalities of gait and mobility: Secondary | ICD-10-CM | POA: Diagnosis not present

## 2019-05-19 DIAGNOSIS — M810 Age-related osteoporosis without current pathological fracture: Secondary | ICD-10-CM | POA: Diagnosis not present

## 2019-05-19 DIAGNOSIS — R7309 Other abnormal glucose: Secondary | ICD-10-CM | POA: Diagnosis not present

## 2019-05-19 DIAGNOSIS — R7301 Impaired fasting glucose: Secondary | ICD-10-CM | POA: Diagnosis not present

## 2019-05-19 DIAGNOSIS — G809 Cerebral palsy, unspecified: Secondary | ICD-10-CM | POA: Diagnosis not present

## 2019-09-21 DIAGNOSIS — R350 Frequency of micturition: Secondary | ICD-10-CM | POA: Diagnosis not present

## 2019-12-21 ENCOUNTER — Ambulatory Visit: Payer: Medicare HMO | Admitting: Podiatry

## 2019-12-21 ENCOUNTER — Encounter: Payer: Self-pay | Admitting: Podiatry

## 2019-12-21 ENCOUNTER — Other Ambulatory Visit: Payer: Self-pay

## 2019-12-21 DIAGNOSIS — M79675 Pain in left toe(s): Secondary | ICD-10-CM | POA: Diagnosis not present

## 2019-12-21 DIAGNOSIS — B351 Tinea unguium: Secondary | ICD-10-CM | POA: Diagnosis not present

## 2019-12-21 DIAGNOSIS — M79674 Pain in right toe(s): Secondary | ICD-10-CM

## 2019-12-21 NOTE — Progress Notes (Signed)
Subjective:   Patient ID: York Grice, female   DOB: 64 y.o.   MRN: [id number]   HPI Patient presents stating she cannot cut her toenails and she is in wheelchair.  Patient has thick yellow brittle nailbeds 1-5 both feet that are bothersome.  Patient does not smoke and is not significantly active   Review of Systems  All other systems reviewed and are negative.       Objective:  Physical Exam Vitals and nursing note reviewed.  Constitutional:      Appearance: She is well-developed and well-nourished.  Cardiovascular:     Pulses: Intact distal pulses.  Pulmonary:     Effort: Pulmonary effort is normal.  Musculoskeletal:        General: Normal range of motion.  Skin:    General: Skin is warm.  Neurological:     Mental Status: She is alert.     Mild reduction of pulses bilateral with swelling of her feet secondary to dependent position with thick yellow brittle nailbeds 1-5 both feet that are painful and make shoe gear difficult.  Patient is found to have incurvation of the beds also and does have good digital perfusion     Assessment:  Chronic mycotic painful nailbeds 1-5 both feet     Plan:  H&P reviewed condition and educated her and caregiver debrided nailbeds 1-5 both feet no iatrogenic bleeding reappoint routine care with daily inspections of her feet to be done

## 2019-12-29 DIAGNOSIS — R69 Illness, unspecified: Secondary | ICD-10-CM | POA: Diagnosis not present

## 2020-01-02 DIAGNOSIS — J309 Allergic rhinitis, unspecified: Secondary | ICD-10-CM | POA: Diagnosis not present

## 2020-01-02 DIAGNOSIS — R7301 Impaired fasting glucose: Secondary | ICD-10-CM | POA: Diagnosis not present

## 2020-01-02 DIAGNOSIS — Z23 Encounter for immunization: Secondary | ICD-10-CM | POA: Diagnosis not present

## 2020-01-02 DIAGNOSIS — Z1211 Encounter for screening for malignant neoplasm of colon: Secondary | ICD-10-CM | POA: Diagnosis not present

## 2020-01-02 DIAGNOSIS — M810 Age-related osteoporosis without current pathological fracture: Secondary | ICD-10-CM | POA: Diagnosis not present

## 2020-01-02 DIAGNOSIS — H6123 Impacted cerumen, bilateral: Secondary | ICD-10-CM | POA: Diagnosis not present

## 2020-01-02 DIAGNOSIS — E782 Mixed hyperlipidemia: Secondary | ICD-10-CM | POA: Diagnosis not present

## 2020-01-02 DIAGNOSIS — G809 Cerebral palsy, unspecified: Secondary | ICD-10-CM | POA: Diagnosis not present

## 2020-01-02 DIAGNOSIS — Z0000 Encounter for general adult medical examination without abnormal findings: Secondary | ICD-10-CM | POA: Diagnosis not present

## 2020-01-03 ENCOUNTER — Other Ambulatory Visit: Payer: Self-pay | Admitting: Family Medicine

## 2020-01-03 DIAGNOSIS — Z1231 Encounter for screening mammogram for malignant neoplasm of breast: Secondary | ICD-10-CM

## 2020-01-03 DIAGNOSIS — M810 Age-related osteoporosis without current pathological fracture: Secondary | ICD-10-CM

## 2020-01-16 DIAGNOSIS — Z1211 Encounter for screening for malignant neoplasm of colon: Secondary | ICD-10-CM | POA: Diagnosis not present

## 2020-01-16 DIAGNOSIS — Z0000 Encounter for general adult medical examination without abnormal findings: Secondary | ICD-10-CM | POA: Diagnosis not present

## 2020-02-06 DIAGNOSIS — R945 Abnormal results of liver function studies: Secondary | ICD-10-CM | POA: Diagnosis not present

## 2020-03-30 ENCOUNTER — Encounter: Payer: Self-pay | Admitting: Podiatry

## 2020-03-30 ENCOUNTER — Ambulatory Visit (INDEPENDENT_AMBULATORY_CARE_PROVIDER_SITE_OTHER): Payer: Medicare HMO | Admitting: Podiatry

## 2020-03-30 ENCOUNTER — Other Ambulatory Visit: Payer: Self-pay

## 2020-03-30 DIAGNOSIS — R296 Repeated falls: Secondary | ICD-10-CM | POA: Insufficient documentation

## 2020-03-30 DIAGNOSIS — R030 Elevated blood-pressure reading, without diagnosis of hypertension: Secondary | ICD-10-CM | POA: Insufficient documentation

## 2020-03-30 DIAGNOSIS — G809 Cerebral palsy, unspecified: Secondary | ICD-10-CM | POA: Insufficient documentation

## 2020-03-30 DIAGNOSIS — M810 Age-related osteoporosis without current pathological fracture: Secondary | ICD-10-CM | POA: Insufficient documentation

## 2020-03-30 DIAGNOSIS — M79675 Pain in left toe(s): Secondary | ICD-10-CM | POA: Diagnosis not present

## 2020-03-30 DIAGNOSIS — M79674 Pain in right toe(s): Secondary | ICD-10-CM | POA: Diagnosis not present

## 2020-03-30 DIAGNOSIS — E669 Obesity, unspecified: Secondary | ICD-10-CM | POA: Insufficient documentation

## 2020-03-30 DIAGNOSIS — E782 Mixed hyperlipidemia: Secondary | ICD-10-CM | POA: Insufficient documentation

## 2020-03-30 DIAGNOSIS — B351 Tinea unguium: Secondary | ICD-10-CM

## 2020-03-30 DIAGNOSIS — R1310 Dysphagia, unspecified: Secondary | ICD-10-CM | POA: Insufficient documentation

## 2020-03-30 DIAGNOSIS — J309 Allergic rhinitis, unspecified: Secondary | ICD-10-CM | POA: Insufficient documentation

## 2020-03-30 DIAGNOSIS — R7301 Impaired fasting glucose: Secondary | ICD-10-CM | POA: Insufficient documentation

## 2020-03-30 NOTE — Progress Notes (Signed)
This patient returns to my office for at risk foot care.  This patient requires this care by a professional since this patient will be at risk due to having Cerebral  Palsy.  This patient is unable to cut nails herself since the patient cannot reach her nails. This patient presents to the office in a wheelchair and with female caregiver. wearing shoes.  This patient presents for at risk foot care today.  General Appearance  Alert, conversant and in no acute stress.  Vascular  Dorsalis pedis and posterior tibial  pulses are weakly  palpable  bilaterally.  Capillary return is within normal limits  Bilaterally.  Cold feet bilaterally.  Absent digital hair noted.  Neurologic  Senn-Weinstein monofilament wire test within normal limits  bilaterally. Muscle power within normal limits bilaterally.  Nails Thick disfigured discolored nails with subungual debris  from hallux to fifth toes bilaterally. No evidence of bacterial infection or drainage bilaterally.  Orthopedic  No limitations of motion  feet .  No crepitus or effusions noted.  No bony pathology or digital deformities noted.  Skin  normotropic skin with no porokeratosis noted bilaterally.  No signs of infections or ulcers noted.     Onychomycosis  Pain in right toes  Pain in left toes  Consent was obtained for treatment procedures.   Mechanical debridement of nails 1-5  bilaterally performed with a nail nipper.  Filed with dremel without incident.    Return office visit   3 months                  Told patient to return for periodic foot care and evaluation due to potential at risk complications.   Gardiner Barefoot DPM

## 2020-04-16 ENCOUNTER — Other Ambulatory Visit: Payer: Medicare HMO

## 2020-04-16 ENCOUNTER — Inpatient Hospital Stay: Admission: RE | Admit: 2020-04-16 | Payer: Medicare HMO | Source: Ambulatory Visit

## 2020-07-06 ENCOUNTER — Ambulatory Visit: Payer: Medicare HMO | Admitting: Podiatry

## 2020-07-28 DIAGNOSIS — G809 Cerebral palsy, unspecified: Secondary | ICD-10-CM | POA: Diagnosis not present

## 2020-07-28 DIAGNOSIS — R2681 Unsteadiness on feet: Secondary | ICD-10-CM | POA: Diagnosis not present

## 2020-07-28 DIAGNOSIS — M6281 Muscle weakness (generalized): Secondary | ICD-10-CM | POA: Diagnosis not present

## 2020-07-28 DIAGNOSIS — M810 Age-related osteoporosis without current pathological fracture: Secondary | ICD-10-CM | POA: Diagnosis not present

## 2020-07-28 DIAGNOSIS — J309 Allergic rhinitis, unspecified: Secondary | ICD-10-CM | POA: Diagnosis not present

## 2020-07-28 DIAGNOSIS — E782 Mixed hyperlipidemia: Secondary | ICD-10-CM | POA: Diagnosis not present

## 2020-07-30 DIAGNOSIS — M6281 Muscle weakness (generalized): Secondary | ICD-10-CM | POA: Diagnosis not present

## 2020-07-30 DIAGNOSIS — R2681 Unsteadiness on feet: Secondary | ICD-10-CM | POA: Diagnosis not present

## 2020-07-30 DIAGNOSIS — E782 Mixed hyperlipidemia: Secondary | ICD-10-CM | POA: Diagnosis not present

## 2020-07-30 DIAGNOSIS — J309 Allergic rhinitis, unspecified: Secondary | ICD-10-CM | POA: Diagnosis not present

## 2020-07-30 DIAGNOSIS — M810 Age-related osteoporosis without current pathological fracture: Secondary | ICD-10-CM | POA: Diagnosis not present

## 2020-07-30 DIAGNOSIS — G809 Cerebral palsy, unspecified: Secondary | ICD-10-CM | POA: Diagnosis not present

## 2020-07-31 DIAGNOSIS — J309 Allergic rhinitis, unspecified: Secondary | ICD-10-CM | POA: Diagnosis not present

## 2020-07-31 DIAGNOSIS — M6281 Muscle weakness (generalized): Secondary | ICD-10-CM | POA: Diagnosis not present

## 2020-07-31 DIAGNOSIS — M810 Age-related osteoporosis without current pathological fracture: Secondary | ICD-10-CM | POA: Diagnosis not present

## 2020-07-31 DIAGNOSIS — E782 Mixed hyperlipidemia: Secondary | ICD-10-CM | POA: Diagnosis not present

## 2020-07-31 DIAGNOSIS — R2681 Unsteadiness on feet: Secondary | ICD-10-CM | POA: Diagnosis not present

## 2020-07-31 DIAGNOSIS — G809 Cerebral palsy, unspecified: Secondary | ICD-10-CM | POA: Diagnosis not present

## 2020-08-01 DIAGNOSIS — R2681 Unsteadiness on feet: Secondary | ICD-10-CM | POA: Diagnosis not present

## 2020-08-01 DIAGNOSIS — M810 Age-related osteoporosis without current pathological fracture: Secondary | ICD-10-CM | POA: Diagnosis not present

## 2020-08-01 DIAGNOSIS — R5381 Other malaise: Secondary | ICD-10-CM | POA: Diagnosis not present

## 2020-08-01 DIAGNOSIS — E785 Hyperlipidemia, unspecified: Secondary | ICD-10-CM | POA: Diagnosis not present

## 2020-08-01 DIAGNOSIS — M2550 Pain in unspecified joint: Secondary | ICD-10-CM | POA: Diagnosis not present

## 2020-08-01 DIAGNOSIS — M6281 Muscle weakness (generalized): Secondary | ICD-10-CM | POA: Diagnosis not present

## 2020-08-01 DIAGNOSIS — G809 Cerebral palsy, unspecified: Secondary | ICD-10-CM | POA: Diagnosis not present

## 2020-08-01 DIAGNOSIS — J309 Allergic rhinitis, unspecified: Secondary | ICD-10-CM | POA: Diagnosis not present

## 2020-08-01 DIAGNOSIS — E782 Mixed hyperlipidemia: Secondary | ICD-10-CM | POA: Diagnosis not present

## 2020-08-02 DIAGNOSIS — M6281 Muscle weakness (generalized): Secondary | ICD-10-CM | POA: Diagnosis not present

## 2020-08-02 DIAGNOSIS — E782 Mixed hyperlipidemia: Secondary | ICD-10-CM | POA: Diagnosis not present

## 2020-08-02 DIAGNOSIS — N390 Urinary tract infection, site not specified: Secondary | ICD-10-CM | POA: Diagnosis not present

## 2020-08-02 DIAGNOSIS — G809 Cerebral palsy, unspecified: Secondary | ICD-10-CM | POA: Diagnosis not present

## 2020-08-02 DIAGNOSIS — R2681 Unsteadiness on feet: Secondary | ICD-10-CM | POA: Diagnosis not present

## 2020-08-02 DIAGNOSIS — D519 Vitamin B12 deficiency anemia, unspecified: Secondary | ICD-10-CM | POA: Diagnosis not present

## 2020-08-02 DIAGNOSIS — M810 Age-related osteoporosis without current pathological fracture: Secondary | ICD-10-CM | POA: Diagnosis not present

## 2020-08-02 DIAGNOSIS — J309 Allergic rhinitis, unspecified: Secondary | ICD-10-CM | POA: Diagnosis not present

## 2020-08-03 DIAGNOSIS — R2681 Unsteadiness on feet: Secondary | ICD-10-CM | POA: Diagnosis not present

## 2020-08-03 DIAGNOSIS — E782 Mixed hyperlipidemia: Secondary | ICD-10-CM | POA: Diagnosis not present

## 2020-08-03 DIAGNOSIS — J309 Allergic rhinitis, unspecified: Secondary | ICD-10-CM | POA: Diagnosis not present

## 2020-08-03 DIAGNOSIS — M810 Age-related osteoporosis without current pathological fracture: Secondary | ICD-10-CM | POA: Diagnosis not present

## 2020-08-03 DIAGNOSIS — G809 Cerebral palsy, unspecified: Secondary | ICD-10-CM | POA: Diagnosis not present

## 2020-08-03 DIAGNOSIS — M6281 Muscle weakness (generalized): Secondary | ICD-10-CM | POA: Diagnosis not present

## 2020-08-05 DIAGNOSIS — M810 Age-related osteoporosis without current pathological fracture: Secondary | ICD-10-CM | POA: Diagnosis not present

## 2020-08-05 DIAGNOSIS — E782 Mixed hyperlipidemia: Secondary | ICD-10-CM | POA: Diagnosis not present

## 2020-08-05 DIAGNOSIS — G809 Cerebral palsy, unspecified: Secondary | ICD-10-CM | POA: Diagnosis not present

## 2020-08-05 DIAGNOSIS — M6281 Muscle weakness (generalized): Secondary | ICD-10-CM | POA: Diagnosis not present

## 2020-08-05 DIAGNOSIS — R2681 Unsteadiness on feet: Secondary | ICD-10-CM | POA: Diagnosis not present

## 2020-08-05 DIAGNOSIS — J309 Allergic rhinitis, unspecified: Secondary | ICD-10-CM | POA: Diagnosis not present

## 2020-08-06 DIAGNOSIS — N390 Urinary tract infection, site not specified: Secondary | ICD-10-CM | POA: Diagnosis not present

## 2020-08-06 DIAGNOSIS — G809 Cerebral palsy, unspecified: Secondary | ICD-10-CM | POA: Diagnosis not present

## 2020-08-06 DIAGNOSIS — E782 Mixed hyperlipidemia: Secondary | ICD-10-CM | POA: Diagnosis not present

## 2020-08-07 DIAGNOSIS — G809 Cerebral palsy, unspecified: Secondary | ICD-10-CM | POA: Diagnosis not present

## 2020-08-07 DIAGNOSIS — E782 Mixed hyperlipidemia: Secondary | ICD-10-CM | POA: Diagnosis not present

## 2020-08-08 DIAGNOSIS — E782 Mixed hyperlipidemia: Secondary | ICD-10-CM | POA: Diagnosis not present

## 2020-08-08 DIAGNOSIS — G809 Cerebral palsy, unspecified: Secondary | ICD-10-CM | POA: Diagnosis not present

## 2020-08-09 DIAGNOSIS — G809 Cerebral palsy, unspecified: Secondary | ICD-10-CM | POA: Diagnosis not present

## 2020-08-09 DIAGNOSIS — E782 Mixed hyperlipidemia: Secondary | ICD-10-CM | POA: Diagnosis not present

## 2020-08-11 DIAGNOSIS — E782 Mixed hyperlipidemia: Secondary | ICD-10-CM | POA: Diagnosis not present

## 2020-08-11 DIAGNOSIS — G809 Cerebral palsy, unspecified: Secondary | ICD-10-CM | POA: Diagnosis not present

## 2020-08-13 DIAGNOSIS — G809 Cerebral palsy, unspecified: Secondary | ICD-10-CM | POA: Diagnosis not present

## 2020-08-13 DIAGNOSIS — E782 Mixed hyperlipidemia: Secondary | ICD-10-CM | POA: Diagnosis not present

## 2020-08-13 DIAGNOSIS — I739 Peripheral vascular disease, unspecified: Secondary | ICD-10-CM | POA: Diagnosis not present

## 2020-08-13 DIAGNOSIS — M2042 Other hammer toe(s) (acquired), left foot: Secondary | ICD-10-CM | POA: Diagnosis not present

## 2020-08-13 DIAGNOSIS — R262 Difficulty in walking, not elsewhere classified: Secondary | ICD-10-CM | POA: Diagnosis not present

## 2020-08-13 DIAGNOSIS — B351 Tinea unguium: Secondary | ICD-10-CM | POA: Diagnosis not present

## 2020-08-13 DIAGNOSIS — M2041 Other hammer toe(s) (acquired), right foot: Secondary | ICD-10-CM | POA: Diagnosis not present

## 2020-08-14 DIAGNOSIS — E782 Mixed hyperlipidemia: Secondary | ICD-10-CM | POA: Diagnosis not present

## 2020-08-14 DIAGNOSIS — G809 Cerebral palsy, unspecified: Secondary | ICD-10-CM | POA: Diagnosis not present

## 2020-08-15 DIAGNOSIS — E782 Mixed hyperlipidemia: Secondary | ICD-10-CM | POA: Diagnosis not present

## 2020-08-15 DIAGNOSIS — G809 Cerebral palsy, unspecified: Secondary | ICD-10-CM | POA: Diagnosis not present

## 2020-08-16 DIAGNOSIS — E782 Mixed hyperlipidemia: Secondary | ICD-10-CM | POA: Diagnosis not present

## 2020-08-16 DIAGNOSIS — G809 Cerebral palsy, unspecified: Secondary | ICD-10-CM | POA: Diagnosis not present

## 2020-08-17 DIAGNOSIS — E782 Mixed hyperlipidemia: Secondary | ICD-10-CM | POA: Diagnosis not present

## 2020-08-17 DIAGNOSIS — G809 Cerebral palsy, unspecified: Secondary | ICD-10-CM | POA: Diagnosis not present

## 2020-08-18 DIAGNOSIS — G809 Cerebral palsy, unspecified: Secondary | ICD-10-CM | POA: Diagnosis not present

## 2020-08-18 DIAGNOSIS — E782 Mixed hyperlipidemia: Secondary | ICD-10-CM | POA: Diagnosis not present

## 2020-08-20 DIAGNOSIS — E782 Mixed hyperlipidemia: Secondary | ICD-10-CM | POA: Diagnosis not present

## 2020-08-20 DIAGNOSIS — G809 Cerebral palsy, unspecified: Secondary | ICD-10-CM | POA: Diagnosis not present

## 2020-08-21 DIAGNOSIS — G809 Cerebral palsy, unspecified: Secondary | ICD-10-CM | POA: Diagnosis not present

## 2020-08-21 DIAGNOSIS — E782 Mixed hyperlipidemia: Secondary | ICD-10-CM | POA: Diagnosis not present

## 2020-08-22 DIAGNOSIS — G809 Cerebral palsy, unspecified: Secondary | ICD-10-CM | POA: Diagnosis not present

## 2020-08-22 DIAGNOSIS — E782 Mixed hyperlipidemia: Secondary | ICD-10-CM | POA: Diagnosis not present

## 2020-08-23 DIAGNOSIS — G809 Cerebral palsy, unspecified: Secondary | ICD-10-CM | POA: Diagnosis not present

## 2020-08-23 DIAGNOSIS — E782 Mixed hyperlipidemia: Secondary | ICD-10-CM | POA: Diagnosis not present

## 2020-08-24 DIAGNOSIS — G809 Cerebral palsy, unspecified: Secondary | ICD-10-CM | POA: Diagnosis not present

## 2020-08-24 DIAGNOSIS — E782 Mixed hyperlipidemia: Secondary | ICD-10-CM | POA: Diagnosis not present

## 2020-08-28 DIAGNOSIS — G809 Cerebral palsy, unspecified: Secondary | ICD-10-CM | POA: Diagnosis not present

## 2020-08-28 DIAGNOSIS — E782 Mixed hyperlipidemia: Secondary | ICD-10-CM | POA: Diagnosis not present

## 2020-08-31 ENCOUNTER — Ambulatory Visit
Admission: RE | Admit: 2020-08-31 | Discharge: 2020-08-31 | Disposition: A | Discharge status: Home / Self Care | Payer: Medicare HMO | Source: Ambulatory Visit | Attending: Family Medicine | Admitting: Family Medicine

## 2020-08-31 ENCOUNTER — Other Ambulatory Visit: Payer: Self-pay

## 2020-08-31 DIAGNOSIS — Z1231 Encounter for screening mammogram for malignant neoplasm of breast: Secondary | ICD-10-CM | POA: Diagnosis not present

## 2020-09-03 DIAGNOSIS — M810 Age-related osteoporosis without current pathological fracture: Secondary | ICD-10-CM | POA: Diagnosis not present

## 2020-09-03 DIAGNOSIS — K219 Gastro-esophageal reflux disease without esophagitis: Secondary | ICD-10-CM | POA: Diagnosis not present

## 2020-09-03 DIAGNOSIS — G809 Cerebral palsy, unspecified: Secondary | ICD-10-CM | POA: Diagnosis not present

## 2020-09-03 DIAGNOSIS — R5381 Other malaise: Secondary | ICD-10-CM | POA: Diagnosis not present

## 2020-09-04 DIAGNOSIS — G809 Cerebral palsy, unspecified: Secondary | ICD-10-CM | POA: Diagnosis not present

## 2020-09-04 DIAGNOSIS — E782 Mixed hyperlipidemia: Secondary | ICD-10-CM | POA: Diagnosis not present

## 2020-09-07 ENCOUNTER — Other Ambulatory Visit: Payer: Self-pay | Admitting: Family Medicine

## 2020-09-07 DIAGNOSIS — R928 Other abnormal and inconclusive findings on diagnostic imaging of breast: Secondary | ICD-10-CM

## 2020-09-24 ENCOUNTER — Other Ambulatory Visit: Payer: Self-pay

## 2020-09-24 ENCOUNTER — Ambulatory Visit
Admission: RE | Admit: 2020-09-24 | Discharge: 2020-09-24 | Disposition: A | Discharge status: Home / Self Care | Payer: Medicare HMO | Source: Ambulatory Visit | Attending: Family Medicine | Admitting: Family Medicine

## 2020-09-24 ENCOUNTER — Other Ambulatory Visit: Payer: Self-pay | Admitting: Family Medicine

## 2020-09-24 DIAGNOSIS — R922 Inconclusive mammogram: Secondary | ICD-10-CM | POA: Diagnosis not present

## 2020-09-24 DIAGNOSIS — R928 Other abnormal and inconclusive findings on diagnostic imaging of breast: Secondary | ICD-10-CM

## 2020-09-27 ENCOUNTER — Ambulatory Visit
Admission: RE | Admit: 2020-09-27 | Discharge: 2020-09-27 | Disposition: A | Discharge status: Home / Self Care | Payer: Medicare HMO | Source: Ambulatory Visit | Attending: Family Medicine | Admitting: Family Medicine

## 2020-09-27 ENCOUNTER — Other Ambulatory Visit: Payer: Self-pay

## 2020-09-27 DIAGNOSIS — C50411 Malignant neoplasm of upper-outer quadrant of right female breast: Secondary | ICD-10-CM | POA: Diagnosis not present

## 2020-09-27 DIAGNOSIS — R928 Other abnormal and inconclusive findings on diagnostic imaging of breast: Secondary | ICD-10-CM

## 2020-09-27 DIAGNOSIS — Z170 Estrogen receptor positive status [ER+]: Secondary | ICD-10-CM | POA: Diagnosis not present

## 2020-10-04 DIAGNOSIS — J189 Pneumonia, unspecified organism: Secondary | ICD-10-CM | POA: Diagnosis not present

## 2020-10-04 DIAGNOSIS — G809 Cerebral palsy, unspecified: Secondary | ICD-10-CM | POA: Diagnosis not present

## 2020-10-04 DIAGNOSIS — U071 COVID-19: Secondary | ICD-10-CM | POA: Diagnosis not present

## 2020-10-04 DIAGNOSIS — E441 Mild protein-calorie malnutrition: Secondary | ICD-10-CM | POA: Diagnosis not present

## 2020-10-05 DIAGNOSIS — R1310 Dysphagia, unspecified: Secondary | ICD-10-CM | POA: Diagnosis not present

## 2020-10-05 DIAGNOSIS — G809 Cerebral palsy, unspecified: Secondary | ICD-10-CM | POA: Diagnosis not present

## 2020-10-05 DIAGNOSIS — J309 Allergic rhinitis, unspecified: Secondary | ICD-10-CM | POA: Diagnosis not present

## 2020-10-05 DIAGNOSIS — R1312 Dysphagia, oropharyngeal phase: Secondary | ICD-10-CM | POA: Diagnosis not present

## 2020-10-05 DIAGNOSIS — M810 Age-related osteoporosis without current pathological fracture: Secondary | ICD-10-CM | POA: Diagnosis not present

## 2020-10-08 DIAGNOSIS — J309 Allergic rhinitis, unspecified: Secondary | ICD-10-CM | POA: Diagnosis not present

## 2020-10-08 DIAGNOSIS — G809 Cerebral palsy, unspecified: Secondary | ICD-10-CM | POA: Diagnosis not present

## 2020-10-08 DIAGNOSIS — R1310 Dysphagia, unspecified: Secondary | ICD-10-CM | POA: Diagnosis not present

## 2020-10-08 DIAGNOSIS — R1312 Dysphagia, oropharyngeal phase: Secondary | ICD-10-CM | POA: Diagnosis not present

## 2020-10-08 DIAGNOSIS — R2689 Other abnormalities of gait and mobility: Secondary | ICD-10-CM | POA: Diagnosis not present

## 2020-10-08 DIAGNOSIS — M810 Age-related osteoporosis without current pathological fracture: Secondary | ICD-10-CM | POA: Diagnosis not present

## 2020-10-10 DIAGNOSIS — R1310 Dysphagia, unspecified: Secondary | ICD-10-CM | POA: Diagnosis not present

## 2020-10-10 DIAGNOSIS — M810 Age-related osteoporosis without current pathological fracture: Secondary | ICD-10-CM | POA: Diagnosis not present

## 2020-10-10 DIAGNOSIS — J309 Allergic rhinitis, unspecified: Secondary | ICD-10-CM | POA: Diagnosis not present

## 2020-10-10 DIAGNOSIS — G809 Cerebral palsy, unspecified: Secondary | ICD-10-CM | POA: Diagnosis not present

## 2020-10-10 DIAGNOSIS — R2689 Other abnormalities of gait and mobility: Secondary | ICD-10-CM | POA: Diagnosis not present

## 2020-10-10 DIAGNOSIS — R1312 Dysphagia, oropharyngeal phase: Secondary | ICD-10-CM | POA: Diagnosis not present

## 2020-10-11 DIAGNOSIS — C50411 Malignant neoplasm of upper-outer quadrant of right female breast: Secondary | ICD-10-CM | POA: Diagnosis not present

## 2020-10-11 DIAGNOSIS — Z170 Estrogen receptor positive status [ER+]: Secondary | ICD-10-CM | POA: Diagnosis not present

## 2020-10-11 DIAGNOSIS — R1312 Dysphagia, oropharyngeal phase: Secondary | ICD-10-CM | POA: Diagnosis not present

## 2020-10-11 DIAGNOSIS — G809 Cerebral palsy, unspecified: Secondary | ICD-10-CM | POA: Diagnosis not present

## 2020-10-11 DIAGNOSIS — M810 Age-related osteoporosis without current pathological fracture: Secondary | ICD-10-CM | POA: Diagnosis not present

## 2020-10-11 DIAGNOSIS — J309 Allergic rhinitis, unspecified: Secondary | ICD-10-CM | POA: Diagnosis not present

## 2020-10-11 DIAGNOSIS — R1310 Dysphagia, unspecified: Secondary | ICD-10-CM | POA: Diagnosis not present

## 2020-10-11 DIAGNOSIS — R2689 Other abnormalities of gait and mobility: Secondary | ICD-10-CM | POA: Diagnosis not present

## 2020-10-12 DIAGNOSIS — J309 Allergic rhinitis, unspecified: Secondary | ICD-10-CM | POA: Diagnosis not present

## 2020-10-12 DIAGNOSIS — R2689 Other abnormalities of gait and mobility: Secondary | ICD-10-CM | POA: Diagnosis not present

## 2020-10-12 DIAGNOSIS — R1312 Dysphagia, oropharyngeal phase: Secondary | ICD-10-CM | POA: Diagnosis not present

## 2020-10-12 DIAGNOSIS — G809 Cerebral palsy, unspecified: Secondary | ICD-10-CM | POA: Diagnosis not present

## 2020-10-12 DIAGNOSIS — R1310 Dysphagia, unspecified: Secondary | ICD-10-CM | POA: Diagnosis not present

## 2020-10-12 DIAGNOSIS — M810 Age-related osteoporosis without current pathological fracture: Secondary | ICD-10-CM | POA: Diagnosis not present

## 2020-10-13 DIAGNOSIS — R1312 Dysphagia, oropharyngeal phase: Secondary | ICD-10-CM | POA: Diagnosis not present

## 2020-10-13 DIAGNOSIS — M810 Age-related osteoporosis without current pathological fracture: Secondary | ICD-10-CM | POA: Diagnosis not present

## 2020-10-13 DIAGNOSIS — R1310 Dysphagia, unspecified: Secondary | ICD-10-CM | POA: Diagnosis not present

## 2020-10-13 DIAGNOSIS — R2689 Other abnormalities of gait and mobility: Secondary | ICD-10-CM | POA: Diagnosis not present

## 2020-10-13 DIAGNOSIS — J309 Allergic rhinitis, unspecified: Secondary | ICD-10-CM | POA: Diagnosis not present

## 2020-10-13 DIAGNOSIS — G809 Cerebral palsy, unspecified: Secondary | ICD-10-CM | POA: Diagnosis not present

## 2020-10-15 DIAGNOSIS — M810 Age-related osteoporosis without current pathological fracture: Secondary | ICD-10-CM | POA: Diagnosis not present

## 2020-10-15 DIAGNOSIS — J309 Allergic rhinitis, unspecified: Secondary | ICD-10-CM | POA: Diagnosis not present

## 2020-10-15 DIAGNOSIS — E785 Hyperlipidemia, unspecified: Secondary | ICD-10-CM | POA: Diagnosis not present

## 2020-10-15 DIAGNOSIS — E669 Obesity, unspecified: Secondary | ICD-10-CM | POA: Diagnosis not present

## 2020-10-15 DIAGNOSIS — Z20822 Contact with and (suspected) exposure to covid-19: Secondary | ICD-10-CM | POA: Diagnosis not present

## 2020-10-15 DIAGNOSIS — C50411 Malignant neoplasm of upper-outer quadrant of right female breast: Secondary | ICD-10-CM | POA: Diagnosis not present

## 2020-10-15 DIAGNOSIS — G809 Cerebral palsy, unspecified: Secondary | ICD-10-CM | POA: Diagnosis not present

## 2020-10-15 DIAGNOSIS — Z170 Estrogen receptor positive status [ER+]: Secondary | ICD-10-CM | POA: Diagnosis not present

## 2020-10-16 DIAGNOSIS — E785 Hyperlipidemia, unspecified: Secondary | ICD-10-CM | POA: Diagnosis not present

## 2020-10-16 DIAGNOSIS — Z20822 Contact with and (suspected) exposure to covid-19: Secondary | ICD-10-CM | POA: Diagnosis not present

## 2020-10-16 DIAGNOSIS — C50411 Malignant neoplasm of upper-outer quadrant of right female breast: Secondary | ICD-10-CM | POA: Diagnosis not present

## 2020-10-16 DIAGNOSIS — G809 Cerebral palsy, unspecified: Secondary | ICD-10-CM | POA: Diagnosis not present

## 2020-10-16 DIAGNOSIS — Z170 Estrogen receptor positive status [ER+]: Secondary | ICD-10-CM | POA: Diagnosis not present

## 2020-10-16 DIAGNOSIS — M810 Age-related osteoporosis without current pathological fracture: Secondary | ICD-10-CM | POA: Diagnosis not present

## 2020-10-16 DIAGNOSIS — R531 Weakness: Secondary | ICD-10-CM | POA: Diagnosis not present

## 2020-10-16 DIAGNOSIS — J309 Allergic rhinitis, unspecified: Secondary | ICD-10-CM | POA: Diagnosis not present

## 2020-10-16 DIAGNOSIS — Z743 Need for continuous supervision: Secondary | ICD-10-CM | POA: Diagnosis not present

## 2020-10-23 DIAGNOSIS — Z170 Estrogen receptor positive status [ER+]: Secondary | ICD-10-CM | POA: Diagnosis not present

## 2020-10-23 DIAGNOSIS — C50411 Malignant neoplasm of upper-outer quadrant of right female breast: Secondary | ICD-10-CM | POA: Diagnosis not present

## 2020-10-25 ENCOUNTER — Telehealth: Payer: Self-pay | Admitting: Oncology

## 2020-10-25 NOTE — Telephone Encounter (Signed)
Scheduled appt per 10/19 referral. Pt's brother is aware of appt date and time.

## 2020-10-30 ENCOUNTER — Telehealth: Payer: Self-pay | Admitting: Oncology

## 2020-10-30 NOTE — Telephone Encounter (Signed)
Debbie, Transportation ([facility]) called to reschedule patient's 11/1 Appt's to 11/3 Labs 2:00 pm - Consult 2:30 pm

## 2020-11-03 DIAGNOSIS — C50411 Malignant neoplasm of upper-outer quadrant of right female breast: Secondary | ICD-10-CM | POA: Diagnosis not present

## 2020-11-03 DIAGNOSIS — G809 Cerebral palsy, unspecified: Secondary | ICD-10-CM | POA: Diagnosis not present

## 2020-11-03 DIAGNOSIS — E785 Hyperlipidemia, unspecified: Secondary | ICD-10-CM | POA: Diagnosis not present

## 2020-11-03 DIAGNOSIS — M2550 Pain in unspecified joint: Secondary | ICD-10-CM | POA: Diagnosis not present

## 2020-11-06 ENCOUNTER — Ambulatory Visit: Payer: Medicare HMO | Admitting: Oncology

## 2020-11-06 ENCOUNTER — Other Ambulatory Visit: Payer: Medicare HMO

## 2020-11-07 NOTE — Progress Notes (Signed)
[facility]  [address] [place],  [place]  [address] [phone]  Clinic Day:  11/08/2020  Referring physician: Mayra Neer, MD   HISTORY OF PRESENT ILLNESS:  The patient is a 65 y.o. female who I was asked to consult upon for newly diagnosed breast cancer.  According to her brother, the patient recently had a diagnostic mammogram done after the patient apparently felt it.  This study showed the presence of 2 lesions, which were also appreciated per ultrasound.  Biopsies revealed invasive breast cancer.  A right mastectomy was ultimately done in October 2022, which revealed two grade 3 invasive ductal carcinomas measuring 4.1 and 1.1 cm, respectively.  Both tumors were estrogen and progesterone receptor positive, but Her2/neu receptor negative.  A sentinel lymph node was removed, which came back negative for nodal involvement of cancer.  She comes in today with her brother to go over her breast cancer pathology and its implications.  Her brother claims she had not undergone a mammogram in at least 3 years.  She has a 1st cousin who had breast cancer  There is no family history of ovarian cancer.  PAST MEDICAL HISTORY:   Past Medical History:  Diagnosis Date   Cerebral palsy ([place])    Dysphagia    Hyperlipidemia    Osteoporosis     PAST SURGICAL HISTORY:   Past Surgical History:  Procedure Laterality Date   BREAST BIOPSY Right    COLON RESECTION     HIP SURGERY     MULTIPLE LEFT HIP AND LEG SURGERIES   MASTECTOMY Right    SENTINEL LYMPH NODE BIOPSY Right    WISDOM TOOTH EXTRACTION      CURRENT MEDICATIONS:   Current Outpatient Medications  Medication Sig Dispense Refill   famotidine (PEPCID) 40 MG tablet Take 40 mg by mouth at bedtime as needed.     ibuprofen (ADVIL) 600 MG tablet Take 600 mg by mouth every 6 (six) hours as needed.     simvastatin (ZOCOR) 40 MG tablet SMARTSIG:1 Tablet(s) By Mouth Every Evening     simvastatin (ZOCOR)  40 MG tablet every evening.     No current facility-administered medications for this visit.    ALLERGIES:  No Known Allergies  FAMILY HISTORY:   Family History  Problem Relation Age of Onset   Hypertension Mother    Colon cancer Mother    Heart failure Father    Breast cancer Neg Hx     SOCIAL HISTORY:  The patient was born and raised in [place].  She lives in a local nursing facility.  She is not married and has no children.  There is no history of alcohol or tobacco abuse.    REVIEW OF SYSTEMS:  Review of Systems  Constitutional:  Negative for fatigue and fever.  HENT:   Negative for hearing loss and sore throat.   Eyes:  Negative for eye problems.  Respiratory:  Negative for chest tightness, cough and hemoptysis.   Cardiovascular:  Negative for chest pain and palpitations.  Gastrointestinal:  Positive for constipation. Negative for abdominal distention, abdominal pain, blood in stool, diarrhea, nausea and vomiting.  Endocrine: Negative for hot flashes.  Genitourinary:  Negative for difficulty urinating, dysuria, frequency, hematuria and nocturia.   Musculoskeletal:  Negative for arthralgias, back pain, gait problem and myalgias.  Skin: Negative.  Negative for itching and rash.  Neurological: Negative.  Negative for dizziness, extremity weakness, gait problem, headaches, light-headedness and numbness.  Hematological: Negative.  Psychiatric/Behavioral: Negative.  Negative for depression and suicidal ideas. The patient is not nervous/anxious.     PHYSICAL EXAM:  Blood pressure 111/60, pulse (!) 113, temperature 98 F (36.7 C), resp. rate 14, height 5' (1.524 m), weight 160 lb (72.6 kg), SpO2 94 %. Wt Readings from Last 3 Encounters:  11/08/20 160 lb (72.6 kg)   Body mass index is 31.25 kg/m. Performance status (ECOG): 1 - Symptomatic but completely ambulatory Physical Exam Constitutional:      Appearance: Normal appearance.  HENT:     Mouth/Throat:     Pharynx:  Oropharynx is clear. No oropharyngeal exudate.  Cardiovascular:     Rate and Rhythm: Normal rate and regular rhythm.     Heart sounds: No murmur heard.   No friction rub. No gallop.  Pulmonary:     Breath sounds: Normal breath sounds.  Chest:  Breasts:    Right: Absent. No swelling, bleeding, inverted nipple, mass, nipple discharge or skin change.     Left: Normal. No swelling, bleeding, inverted nipple, mass, nipple discharge or skin change.  Abdominal:     General: Bowel sounds are normal. There is no distension.     Palpations: Abdomen is soft. There is no mass.     Tenderness: There is no abdominal tenderness.  Musculoskeletal:        General: No tenderness.     Cervical back: Normal range of motion and neck supple.     Right lower leg: No edema.     Left lower leg: No edema.  Lymphadenopathy:     Cervical: No cervical adenopathy.     Right cervical: No superficial, deep or posterior cervical adenopathy.    Left cervical: No superficial, deep or posterior cervical adenopathy.     Upper Body:     Right upper body: No supraclavicular or axillary adenopathy.     Left upper body: No supraclavicular or axillary adenopathy.     Lower Body: No right inguinal adenopathy. No left inguinal adenopathy.  Skin:    Coloration: Skin is not jaundiced.     Findings: No lesion or rash.  Neurological:     General: No focal deficit present.     Mental Status: She is alert and oriented to person, place, and time. Mental status is at baseline.  Psychiatric:        Mood and Affect: Mood normal.        Behavior: Behavior normal.        Thought Content: Thought content normal.        Judgment: Judgment normal.   ASSESSMENT & PLAN:  A 65 y.o. female who I was asked to consult upon for multifocal stage IB (T2 N0 M0) hormone positive breast cancer.  In clinic today, I went over her breast cancer pathology with the patient and her brother.  Based upon the large size of her 4.1 cm tumor and its grade  3 nature, I will have this tumor undergo Oncotype testing to determine how high her risk is of developing distant disease recurrence over the next decade.  If there is a high risk of distant disease recurrence, I would recommend adjuvant chemotherapy.  Otherwise, endocrine therapy would be al I would recommend in the adjuvant setting.  I will see this patient back in 3-4 weeks to go over her Oncotype test results with her and their implications.  The patient understands all the plans discussed today and is in agreement with them.  I do appreciate Mayra Neer, MD  for his new consult.   Braydin Aloi Macarthur Critchley, MD

## 2020-11-08 ENCOUNTER — Inpatient Hospital Stay: Payer: Medicare HMO

## 2020-11-08 ENCOUNTER — Telehealth: Payer: Self-pay | Admitting: Oncology

## 2020-11-08 ENCOUNTER — Inpatient Hospital Stay: Payer: Medicare HMO | Attending: Oncology | Admitting: Oncology

## 2020-11-08 ENCOUNTER — Encounter: Payer: Self-pay | Admitting: Oncology

## 2020-11-08 VITALS — BP 111/60 | HR 113 | Temp 98.0°F | Resp 14 | Ht 60.0 in | Wt 160.0 lb

## 2020-11-08 DIAGNOSIS — Z170 Estrogen receptor positive status [ER+]: Secondary | ICD-10-CM

## 2020-11-08 DIAGNOSIS — C50511 Malignant neoplasm of lower-outer quadrant of right female breast: Secondary | ICD-10-CM

## 2020-11-08 DIAGNOSIS — C50011 Malignant neoplasm of nipple and areola, right female breast: Secondary | ICD-10-CM

## 2020-11-08 DIAGNOSIS — C50919 Malignant neoplasm of unspecified site of unspecified female breast: Secondary | ICD-10-CM | POA: Insufficient documentation

## 2020-11-08 NOTE — Telephone Encounter (Signed)
Per 11/3 LOS, Debbie, Transporter ([facility]) called to verify patient's Appt's for today

## 2020-11-09 DIAGNOSIS — R2689 Other abnormalities of gait and mobility: Secondary | ICD-10-CM | POA: Diagnosis not present

## 2020-11-09 DIAGNOSIS — G809 Cerebral palsy, unspecified: Secondary | ICD-10-CM | POA: Diagnosis not present

## 2020-11-09 DIAGNOSIS — M810 Age-related osteoporosis without current pathological fracture: Secondary | ICD-10-CM | POA: Diagnosis not present

## 2020-11-09 DIAGNOSIS — E782 Mixed hyperlipidemia: Secondary | ICD-10-CM | POA: Diagnosis not present

## 2020-11-12 ENCOUNTER — Telehealth: Payer: Self-pay | Admitting: Genetic Counselor

## 2020-11-12 NOTE — Telephone Encounter (Signed)
Scheduled appt per 11/3 referral. Pt's brother is aware of appt date and time.

## 2020-11-20 ENCOUNTER — Inpatient Hospital Stay (INDEPENDENT_AMBULATORY_CARE_PROVIDER_SITE_OTHER): Payer: Medicare HMO | Admitting: Genetic Counselor

## 2020-11-20 ENCOUNTER — Inpatient Hospital Stay: Payer: Medicare HMO

## 2020-11-20 DIAGNOSIS — Z803 Family history of malignant neoplasm of breast: Secondary | ICD-10-CM

## 2020-11-20 DIAGNOSIS — Z170 Estrogen receptor positive status [ER+]: Secondary | ICD-10-CM | POA: Diagnosis not present

## 2020-11-20 DIAGNOSIS — Z800 Family history of malignant neoplasm of digestive organs: Secondary | ICD-10-CM

## 2020-11-20 DIAGNOSIS — C50919 Malignant neoplasm of unspecified site of unspecified female breast: Secondary | ICD-10-CM | POA: Diagnosis not present

## 2020-11-20 DIAGNOSIS — C50511 Malignant neoplasm of lower-outer quadrant of right female breast: Secondary | ICD-10-CM

## 2020-11-21 ENCOUNTER — Encounter: Payer: Self-pay | Admitting: Genetic Counselor

## 2020-11-21 DIAGNOSIS — Z800 Family history of malignant neoplasm of digestive organs: Secondary | ICD-10-CM

## 2020-11-21 DIAGNOSIS — Z803 Family history of malignant neoplasm of breast: Secondary | ICD-10-CM

## 2020-11-21 HISTORY — DX: Family history of malignant neoplasm of breast: Z80.3

## 2020-11-21 HISTORY — DX: Family history of malignant neoplasm of digestive organs: Z80.0

## 2020-11-21 NOTE — Progress Notes (Signed)
REFERRING PROVIDER: Marice Potter, MD [place],  [place] [address]  PRIMARY PROVIDER:  Mayra Neer, MD  PRIMARY REASON FOR VISIT:  1. Malignant neoplasm of lower-outer quadrant of right breast of female, estrogen receptor positive ([place])   2. Family history of malignant neoplasm of digestive organ   3. Family history of breast cancer     HISTORY OF PRESENT ILLNESS:   Angela Simpson, a 65 y.o. female, was seen for a [facility] cancer genetics consultation at the request of Dr. Bobby Rumpf due to a personal and family history of cancer.  Angela Simpson presents to clinic today to discuss the possibility of a hereditary predisposition to cancer, to discuss genetic testing, and to further clarify her future cancer risks, as well as potential cancer risks for family members.   In September 2022, at the age of 40, Angela Simpson was diagnosed with invasive ductal carcinoma of the right breast (ER+/PR+/HER2-) s/p mastectomy.    CANCER HISTORY:  Oncology History  Breast cancer ([place])  11/08/2020 Initial Diagnosis   Breast cancer ([place])   11/08/2020 Cancer Staging   Staging form: Breast, AJCC 8th Edition - Pathologic stage from 11/08/2020: Stage IB (pT2, pN0, cM0, G3, ER+, PR+, HER2-) - Signed by Marice Potter, MD on 11/08/2020 Histopathologic type: Infiltrating duct carcinoma, NOS Stage prefix: Initial diagnosis Multigene prognostic tests performed: Other Histologic grading system: 3 grade system       RISK FACTORS:  Colonoscopy: yes;  most recent date unknown . Any excessive radiation exposure in the past: no  Past Medical History:  Diagnosis Date   Cerebral palsy ([place])    Dysphagia    Family history of breast cancer 11/21/2020   Family history of malignant neoplasm of digestive organ 11/21/2020   Hyperlipidemia    Osteoporosis     Past Surgical History:  Procedure Laterality Date   BREAST BIOPSY Right    COLON RESECTION     HIP SURGERY     MULTIPLE LEFT HIP AND LEG  SURGERIES   MASTECTOMY Right    SENTINEL LYMPH NODE BIOPSY Right    WISDOM TOOTH EXTRACTION      Social History   Socioeconomic History   Marital status: Single    Spouse name: Not on file   Number of children: 0   Years of education: 12   Highest education level: Not on file  Occupational History   Occupation: DISABLED  Tobacco Use   Smoking status: Never   Smokeless tobacco: Never  Vaping Use   Vaping Use: Never used  Substance and Sexual Activity   Alcohol use: Never   Drug use: Never   Sexual activity: Not Currently  Other Topics Concern   Not on file  Social History Narrative   Not on file   Social Determinants of Health   Financial Resource Strain: Not on file  Food Insecurity: Not on file  Transportation Needs: Not on file  Physical Activity: Not on file  Stress: Not on file  Social Connections: Not on file     FAMILY HISTORY:  We obtained a detailed, 4-generation family history.  Significant diagnoses are listed below: Family History  Problem Relation Age of Onset   Colon cancer Mother        dx late 71s-early 77s   Skin cancer Father        surgery only; dx after 19   Breast cancer Cousin        maternal female cousin; dx after 67  Skin cancer Paternal Uncle        surgery only; dx after 29   Pancreatic cancer Paternal Aunt        dx 89s    Angela Simpson is unaware of previous family history of genetic testing for hereditary cancer risks. There is no reported Ashkenazi Jewish ancestry. There is no known consanguinity.  GENETIC COUNSELING ASSESSMENT: Angela Simpson is a 65 y.o. female with a personal and family history of cancer which is somewhat suggestive of a hereditary cancer syndrome and predisposition to cancer given the presence of related cancers in the family. We, therefore, discussed and recommended the following at today's visit.   DISCUSSION: We discussed that 5 - 10% of cancer is hereditary, with most cases of hereditary breast cancer  associated with mutations in BRCA1/2.  There are other genes that can be associated with hereditary breast or pancreatic cancer syndromes. We discussed that testing is beneficial for several reasons including knowing how to follow individuals after completing their treatment and understanding if other family members could be at risk for cancer and allowing them to undergo genetic testing.   We reviewed the characteristics, features and inheritance patterns of hereditary cancer syndromes. We also discussed genetic testing, including the appropriate family members to test, the process of testing, insurance coverage and turn-around-time for results. We discussed the implications of a negative, positive, carrier and/or variant of uncertain significant result. We recommended Angela Simpson pursue genetic testing for a panel that includes genes associated with breast, pancreatic, and colon cancers.    The CustomNext-Cancer+RNAinsight panel offered by [facility] includes sequencing and rearrangement analysis for the following 47 genes:  APC, ATM, AXIN2, BARD1, BMPR1A, BRCA1, BRCA2, BRIP1, CDH1, CDK4, CDKN2A, CHEK2, DICER1, EPCAM, GREM1, HOXB13, MEN1, MLH1, MSH2, MSH3, MSH6, MUTYH, NBN, NF1, NF2, NTHL1, PALB2, PMS2, POLD1, POLE, PTEN, RAD51C, RAD51D, RECQL, RET, SDHA, SDHAF2, SDHB, SDHC, SDHD, SMAD4, SMARCA4, STK11, TP53, TSC1, TSC2, and VHL.  RNA data is routinely analyzed for use in variant interpretation for all genes.  Based on Angela Simpson's personal and family history of cancer, she meets medical criteria for genetic testing. Despite that she meets criteria, she may still have an out of pocket cost. We discussed that if her out of pocket cost for testing is over $100, the laboratory should contact them to discuss self-pay options and/or patient pay assistance programs.   PLAN: After considering the risks, benefits, and limitations, Angela Simpson provided informed consent to pursue genetic testing and the blood  sample was sent to [facility] for analysis of the CustomCancer-Next +RNAinsight Panel. Results should be available within approximately 2-3 weeks' time, at which point they will be disclosed by telephone to Angela Simpson's brother, Suezanne Jacquet, as will any additional recommendations warranted by these results. Angela Simpson will receive a summary of her genetic counseling visit and a copy of her results once available. This information will also be available in Epic.    Lastly, we encouraged Angela Simpson to remain in contact with cancer genetics annually so that we can continuously update the family history and inform her of any changes in cancer genetics and testing that may be of benefit for this family.   Angela Simpson's questions were answered to her satisfaction today. Our contact information was provided should additional questions or concerns arise. Thank you for the referral and allowing [place] to share in the care of your patient.   Kalia Vahey M. Joette Catching, [place], [facility] Genetic Counselor [email or web address] (P) [phone]  The patient was seen  for a total of 30 minutes in face-to-face genetic counseling.  The patient was accompanied by her brother, Suezanne Jacquet. Drs. Magrinat, Lindi Adie and/or Burr Medico were available to discuss this case as needed.    _______________________________________________________________________ For Office Staff:  Number of people involved in session: 2 Was an Intern/ student involved with case: no

## 2020-11-22 NOTE — Progress Notes (Incomplete)
[facility]  [address] [place],  [place]  [address] [phone]  Clinic Day:  12/03/2020  Referring physician: Mayra Neer, MD  This document serves as a record of services personally performed by Dequincy Macarthur Critchley, MD. It was created on their behalf by [facility], a trained medical scribe. The creation of this record is based on the scribe's personal observations and the provider's statements to them.  HISTORY OF PRESENT ILLNESS:  The patient is a 65 y.o. female who I recently began seeing for newly diagnosed breast cancer.  According to her brother, the patient recently had a diagnostic mammogram done after the patient apparently felt it.  This study showed the presence of 2 lesions, which were also appreciated per ultrasound.  Biopsies revealed invasive breast cancer.  A right mastectomy was ultimately done in October 2022, which revealed two grade 3 invasive ductal carcinomas measuring 4.1 and 1.1 cm, respectively.  Both tumors were estrogen and progesterone receptor positive, but Her2/neu receptor negative.  A sentinel lymph node was removed, which came back negative for nodal involvement of cancer.  She comes in today with her brother to go over her breast cancer pathology and its implications.  Her brother claims she had not undergone a mammogram in at least 3 years.  She has a 1st cousin who had breast cancer  There is no family history of ovarian cancer.  PHYSICAL EXAM:  There were no vitals taken for this visit. Wt Readings from Last 3 Encounters:  11/08/20 160 lb (72.6 kg)   There is no height or weight on file to calculate BMI. Performance status (ECOG): 1 - Symptomatic but completely ambulatory Physical Exam Constitutional:      Appearance: Normal appearance.  HENT:     Mouth/Throat:     Pharynx: Oropharynx is clear. No oropharyngeal exudate.  Cardiovascular:     Rate and Rhythm: Normal rate and regular rhythm.     Heart sounds:  No murmur heard.   No friction rub. No gallop.  Pulmonary:     Breath sounds: Normal breath sounds.  Chest:  Breasts:    Right: Absent. No swelling, bleeding, inverted nipple, mass, nipple discharge or skin change.     Left: Normal. No swelling, bleeding, inverted nipple, mass, nipple discharge or skin change.  Abdominal:     General: Bowel sounds are normal. There is no distension.     Palpations: Abdomen is soft. There is no mass.     Tenderness: There is no abdominal tenderness.  Musculoskeletal:        General: No tenderness.     Cervical back: Normal range of motion and neck supple.     Right lower leg: No edema.     Left lower leg: No edema.  Lymphadenopathy:     Cervical: No cervical adenopathy.     Right cervical: No superficial, deep or posterior cervical adenopathy.    Left cervical: No superficial, deep or posterior cervical adenopathy.     Upper Body:     Right upper body: No supraclavicular or axillary adenopathy.     Left upper body: No supraclavicular or axillary adenopathy.     Lower Body: No right inguinal adenopathy. No left inguinal adenopathy.  Skin:    Coloration: Skin is not jaundiced.     Findings: No lesion or rash.  Neurological:     General: No focal deficit present.     Mental Status: She is alert and oriented to person, place,  and time. Mental status is at baseline.  Psychiatric:        Mood and Affect: Mood normal.        Behavior: Behavior normal.        Thought Content: Thought content normal.        Judgment: Judgment normal.   ASSESSMENT & PLAN:  A 65 y.o. female who I was asked to consult upon for multifocal stage IB (T2 N0 M0) hormone positive breast cancer.  In clinic today, I went over her breast cancer pathology with the patient and her brother.  Based upon the large size of her 4.1 cm tumor and its grade 3 nature, I will have this tumor undergo Oncotype testing to determine how high her risk is of developing distant disease recurrence over  the next decade.  If there is a high risk of distant disease recurrence, I would recommend adjuvant chemotherapy.  Otherwise, endocrine therapy would be al I would recommend in the adjuvant setting.  I will see this patient back in 3-4 weeks to go over her Oncotype test results with her and their implications.  The patient understands all the plans discussed today and is in agreement with them.  I, Rita Ohara, am acting as scribe for Marice Potter, MD    I have reviewed this report as typed by the medical scribe, and it is complete and accurate.  Dequincy Macarthur Critchley, MD

## 2020-12-03 ENCOUNTER — Ambulatory Visit: Payer: Medicare HMO | Admitting: Oncology

## 2020-12-03 ENCOUNTER — Other Ambulatory Visit: Payer: Self-pay

## 2020-12-03 ENCOUNTER — Inpatient Hospital Stay: Payer: Medicare HMO | Admitting: Oncology

## 2020-12-03 DIAGNOSIS — R5381 Other malaise: Secondary | ICD-10-CM | POA: Diagnosis not present

## 2020-12-03 DIAGNOSIS — K5900 Constipation, unspecified: Secondary | ICD-10-CM | POA: Diagnosis not present

## 2020-12-03 DIAGNOSIS — M810 Age-related osteoporosis without current pathological fracture: Secondary | ICD-10-CM | POA: Diagnosis not present

## 2020-12-03 DIAGNOSIS — K219 Gastro-esophageal reflux disease without esophagitis: Secondary | ICD-10-CM | POA: Diagnosis not present

## 2020-12-04 ENCOUNTER — Other Ambulatory Visit: Payer: Self-pay | Admitting: Surgery

## 2020-12-04 ENCOUNTER — Encounter: Payer: Self-pay | Admitting: Genetic Counselor

## 2020-12-04 ENCOUNTER — Ambulatory Visit: Payer: Self-pay | Admitting: Genetic Counselor

## 2020-12-04 ENCOUNTER — Telehealth: Payer: Self-pay | Admitting: Genetic Counselor

## 2020-12-04 DIAGNOSIS — Z803 Family history of malignant neoplasm of breast: Secondary | ICD-10-CM

## 2020-12-04 DIAGNOSIS — Z1379 Encounter for other screening for genetic and chromosomal anomalies: Secondary | ICD-10-CM | POA: Insufficient documentation

## 2020-12-04 DIAGNOSIS — Z800 Family history of malignant neoplasm of digestive organs: Secondary | ICD-10-CM

## 2020-12-04 DIAGNOSIS — Z1231 Encounter for screening mammogram for malignant neoplasm of breast: Secondary | ICD-10-CM

## 2020-12-04 DIAGNOSIS — C50511 Malignant neoplasm of lower-outer quadrant of right female breast: Secondary | ICD-10-CM

## 2020-12-04 DIAGNOSIS — Z170 Estrogen receptor positive status [ER+]: Secondary | ICD-10-CM

## 2020-12-04 NOTE — Telephone Encounter (Signed)
Revealed negative genetic testing and VUS in RAD51C to patient brother, Suezanne Jacquet.  Discussed that we do not know why she has breast cancer or why there is cancer in the family. It could be familial, due to a different gene that we are not testing, or maybe our current technology may not be able to pick something up.  It will be important for her to keep in contact with genetics to keep up with whether additional testing may be needed.

## 2020-12-04 NOTE — Progress Notes (Addendum)
HPI:   Ms. Rummell was previously seen in the [place] clinic due to a personal and family history of cancer and concerns regarding a hereditary predisposition to cancer. Please refer to our prior cancer genetics clinic note for more information regarding our discussion, assessment and recommendations, at the time. Ms. Brummell's recent genetic test results were disclosed to her brother, Suezanne Jacquet, as were recommendations warranted by these results. These results and recommendations are discussed in more detail below.  CANCER HISTORY:  Oncology History  Breast cancer ([place])  11/08/2020 Initial Diagnosis   Breast cancer ([place])   11/08/2020 Cancer Staging   Staging form: Breast, AJCC 8th Edition - Pathologic stage from 11/08/2020: Stage IB (pT2, pN0, cM0, G3, ER+, PR+, HER2-) - Signed by Marice Potter, MD on 11/08/2020 Histopathologic type: Infiltrating duct carcinoma, NOS Stage prefix: Initial diagnosis Multigene prognostic tests performed: Other Histologic grading system: 3 grade system    12/03/2020 Genetic Testing   Negative hereditary cancer genetic testing: no pathogenic variants detected in [facility] CustomNext-Cancer +RNAinsight Panel.  Variant of uncertain significance detected in RAD51C called  p.T5M (c.14C>T).  Report date is 12/03/2020.   The CustomNext-Cancer+RNAinsight panel offered by [facility] includes sequencing and rearrangement analysis for the following 47 genes:  APC, ATM, AXIN2, BARD1, BMPR1A, BRCA1, BRCA2, BRIP1, CDH1, CDK4, CDKN2A, CHEK2, DICER1, EPCAM, GREM1, HOXB13, MEN1, MLH1, MSH2, MSH3, MSH6, MUTYH, NBN, NF1, NF2, NTHL1, PALB2, PMS2, POLD1, POLE, PTEN, RAD51C, RAD51D, RECQL, RET, SDHA, SDHAF2, SDHB, SDHC, SDHD, SMAD4, SMARCA4, STK11, TP53, TSC1, TSC2, and VHL.  RNA data is routinely analyzed for use in variant interpretation for all genes.     FAMILY HISTORY:  We obtained a detailed, 4-generation family history.  Significant diagnoses are listed  below: Family History  Problem Relation Age of Onset   Colon cancer Mother        dx late 49s-early 3s   Skin cancer Father        surgery only; dx after 53   Breast cancer Cousin        maternal female cousin; dx after 55   Skin cancer Paternal Uncle        surgery only; dx after 62   Pancreatic cancer Paternal Aunt        dx 15s     Ms. Speece is unaware of previous family history of genetic testing for hereditary cancer risks. There is no reported Ashkenazi Jewish ancestry. There is no known consanguinity.  GENETIC TEST RESULTS:  The [facility] CustomNext-Cancer +RNAinsight Panel Panel found no pathogenic mutations. The CustomNext-Cancer+RNAinsight panel offered by [facility] includes sequencing and rearrangement analysis for the following 47 genes:  APC, ATM, AXIN2, BARD1, BMPR1A, BRCA1, BRCA2, BRIP1, CDH1, CDK4, CDKN2A, CHEK2, DICER1, EPCAM, GREM1, HOXB13, MEN1, MLH1, MSH2, MSH3, MSH6, MUTYH, NBN, NF1, NF2, NTHL1, PALB2, PMS2, POLD1, POLE, PTEN, RAD51C, RAD51D, RECQL, RET, SDHA, SDHAF2, SDHB, SDHC, SDHD, SMAD4, SMARCA4, STK11, TP53, TSC1, TSC2, and VHL.  RNA data is routinely analyzed for use in variant interpretation for all genes.  The test report has been scanned into EPIC and is located under the Molecular Pathology section of the Results Review tab.  A portion of the result report is included below for reference. Genetic testing reported out on December 03, 2020.      Genetic testing identified a variant of uncertain significance (VUS) in the RAD51C gene called c.14C>T (p.T5M).  At this time, it is unknown if this variant is associated with an increased risk for cancer or  if it is benign, but most uncertain variants are reclassified to benign. It should not be used to make medical management decisions. With time, we suspect the laboratory will determine the significance of this variant, if any. If the laboratory reclassifies this variant, we will attempt to contact Ms. Lavis to  discuss it further.   Reclassification update: The variant of uncertain significance (VUS) in RAD51C at  c.14C>T (p.T5M) has been reclassified to benign.  The change in variant classification was made as a result of re-review of evidence in light of new variant interpretation guidelines and/or new information. The amended report date is 05/20/2022.     Even though a pathogenic variant was not identified, possible explanations for the cancer in the family may include: There may be no hereditary risk for cancer in the family. The cancers in Ms. Pillars and/or her family may be sporadic/familial or due to other genetic and environmental factors. There may be a gene mutation in one of these genes that current testing methods cannot detect but that chance is small. There could be another gene that has not yet been discovered, or that we have not yet tested, that is responsible for the cancer diagnoses in the family.  It is also possible there is a hereditary cause for the cancer in the family that Ms. Hanni did not inherit.  Therefore, it is important to remain in touch with cancer genetics in the future so that we can continue to offer Ms. Heath the most up to date genetic testing.   ADDITIONAL GENETIC TESTING:  We discussed with Ms. Abt's brother that her genetic testing was fairly extensive.  If there are other relevant genes identified to increase cancer risk that can be analyzed in the future, we would be happy to discuss and coordinate this testing at that time.    CANCER SCREENING RECOMMENDATIONS:  Ms. Hedgecock's test result is considered negative (normal).  This means that we have not identified a hereditary cause for her personal history of breast cancer at this time.    An individual's cancer risk and medical management are not determined by genetic test results alone. Overall cancer risk assessment incorporates additional factors, including personal medical history, family history, and  any available genetic information that may result in a personalized plan for cancer prevention and surveillance. Therefore, it is recommended she continue to follow the cancer management and screening guidelines provided by her oncology and primary healthcare provider.   RECOMMENDATIONS FOR FAMILY MEMBERS:   Since she did not inherit a identifiable mutation in a cancer predisposition gene included on this panel, her children could not have inherited a known mutation from her in one of these genes. Individuals in this family might be at some increased risk of developing cancer, over the general population risk, due to the family history of cancer.  Individuals in the family should notify their providers of the family history of cancer. We recommend women in this family have a yearly mammogram beginning at age 42, or 43 years younger than the earliest onset of cancer, an annual clinical breast exam, and perform monthly breast self-exams.  Family members should have colonoscopies by at age 46, or earlier, as recommended by their providers.  Other members of the family may still carry a pathogenic variant in one of these genes that Ms. Dalrymple did not inherit. Based on the family history of pancreatic cancer in her paternal aunt, we recommend her paternal aunt's first degree relatives (brother, children) have genetic counseling  and testing. Ms. Bottenfield's brother will let us know if we can be of any assistance in coordinating genetic counseling and/or testing for this family member.   We do not recommend familial testing for the RAD51C variant of uncertain significance (VUS).  FOLLOW-UP:  Lastly, we discussed with Ms. Sundell's brother that cancer genetics is a rapidly advancing field and it is possible that new genetic tests will be appropriate for her and/or her family members in the future. We encouraged her to remain in contact with cancer genetics on an annual basis so we can update her personal and family  histories and let her know of advances in cancer genetics that may benefit this family.   Our contact number was provided. Ms. Heinz's questions were answered to her satisfaction, and she knows she is welcome to call us at anytime with additional questions or concerns.   Liara Holm M. Rennie Plowman, MS, [facility] Genetic Counselor [email or web address] (P) [phone]

## 2020-12-06 DIAGNOSIS — G809 Cerebral palsy, unspecified: Secondary | ICD-10-CM | POA: Diagnosis not present

## 2020-12-06 DIAGNOSIS — M810 Age-related osteoporosis without current pathological fracture: Secondary | ICD-10-CM | POA: Diagnosis not present

## 2020-12-06 DIAGNOSIS — J309 Allergic rhinitis, unspecified: Secondary | ICD-10-CM | POA: Diagnosis not present

## 2020-12-06 DIAGNOSIS — M6281 Muscle weakness (generalized): Secondary | ICD-10-CM | POA: Diagnosis not present

## 2020-12-06 DIAGNOSIS — E782 Mixed hyperlipidemia: Secondary | ICD-10-CM | POA: Diagnosis not present

## 2020-12-06 DIAGNOSIS — R1310 Dysphagia, unspecified: Secondary | ICD-10-CM | POA: Diagnosis not present

## 2020-12-06 NOTE — Progress Notes (Signed)
[facility]  [address] [place],  [place]  [address] [phone]  Clinic Day:  12/13/2020  Referring physician: Mayra Neer, MD  This document serves as a record of services personally performed by Ferry Matthis Macarthur Critchley, MD. It was created on their behalf by [facility], a trained medical scribe. The creation of this record is based on the scribe's personal observations and the provider's statements to them.  HISTORY OF PRESENT ILLNESS:  The patient is a 65 y.o. female who I recently began seeing for multifocal stage IB (T2 N0 M0) hormone positive breast cancer.  The patient comes in today with her brother to go over her Oncotype test results to determine if adjuvant chemotherapy is necessary.  Since her last visit, the patient has been doing well.  She denies having any healing complications from her recent right mastectomy, which was done in October 2022.   PHYSICAL EXAM:  Blood pressure (!) 146/64, pulse (!) 114, temperature 98.7 F (37.1 C), resp. rate 18, height 5' (1.524 m), SpO2 94 %. Wt Readings from Last 3 Encounters:  11/08/20 160 lb (72.6 kg)   Body mass index is 31.25 kg/m. Performance status (ECOG): 1 - Symptomatic but completely ambulatory Physical Exam Constitutional:      Appearance: Normal appearance.  HENT:     Mouth/Throat:     Pharynx: Oropharynx is clear. No oropharyngeal exudate.  Cardiovascular:     Rate and Rhythm: Normal rate and regular rhythm.     Heart sounds: No murmur heard.   No friction rub. No gallop.  Pulmonary:     Breath sounds: Normal breath sounds.  Chest:  Breasts:    Right: Absent. No swelling, bleeding, inverted nipple, mass, nipple discharge or skin change.     Left: Normal. No swelling, bleeding, inverted nipple, mass, nipple discharge or skin change.  Abdominal:     General: Bowel sounds are normal. There is no distension.     Palpations: Abdomen is soft. There is no mass.     Tenderness:  There is no abdominal tenderness.  Musculoskeletal:        General: No tenderness.     Cervical back: Normal range of motion and neck supple.     Right lower leg: No edema.     Left lower leg: No edema.  Lymphadenopathy:     Cervical: No cervical adenopathy.     Right cervical: No superficial, deep or posterior cervical adenopathy.    Left cervical: No superficial, deep or posterior cervical adenopathy.     Upper Body:     Right upper body: No supraclavicular or axillary adenopathy.     Left upper body: No supraclavicular or axillary adenopathy.     Lower Body: No right inguinal adenopathy. No left inguinal adenopathy.  Skin:    Coloration: Skin is not jaundiced.     Findings: No lesion or rash.  Neurological:     General: No focal deficit present.     Mental Status: She is alert and oriented to person, place, and time. Mental status is at baseline.  Psychiatric:        Mood and Affect: Mood normal.        Behavior: Behavior normal.        Thought Content: Thought content normal.        Judgment: Judgment normal.   PATHOLOGY:  Her Oncotype results showed a recurrence score of 10.  That equates to a distance recurrence risk of only 3%  if she takes endocrine therapy.  These results show there is a minimal benefit (<1% absolute chemotherapy benefit) for adjuvant chemotherapy.  ASSESSMENT & PLAN:  A 65 y.o. female with multifocal stage IB (T2 N0 M0) hormone positive breast cancer.  In clinic today, I reviewed her Oncotype test results with both her and her brother.  Understandably, they were both pleased to find out that she does not need adjuvant chemotherapy despite having grade 3 pathology.  Based upon this, I will place her on anastrozole endocrine therapy, which she knows to take daily for a total of 5 years.  As she underwent a mastectomy, there is no role for adjuvant breast radiation.  Moving forward, her breast cancer surveillance will consist of her undergoing clinical breast exams  every 4 to 6 months.  Furthermore, she knows to undergo a contralateral mammogram on a yearly basis for her radiographic breast cancer surveillance.  I will see this patient back in April 2023 for her next clinical breast exam.  The patient understands all the plans discussed today and is in agreement with them.  I, Rita Ohara, am acting as scribe for Marice Potter, MD    I have reviewed this report as typed by the medical scribe, and it is complete and accurate.  Dacey Milberger Macarthur Critchley, MD

## 2020-12-11 DIAGNOSIS — Z170 Estrogen receptor positive status [ER+]: Secondary | ICD-10-CM | POA: Diagnosis not present

## 2020-12-11 DIAGNOSIS — E782 Mixed hyperlipidemia: Secondary | ICD-10-CM | POA: Diagnosis not present

## 2020-12-11 DIAGNOSIS — M810 Age-related osteoporosis without current pathological fracture: Secondary | ICD-10-CM | POA: Diagnosis not present

## 2020-12-11 DIAGNOSIS — R1310 Dysphagia, unspecified: Secondary | ICD-10-CM | POA: Diagnosis not present

## 2020-12-11 DIAGNOSIS — G809 Cerebral palsy, unspecified: Secondary | ICD-10-CM | POA: Diagnosis not present

## 2020-12-11 DIAGNOSIS — J309 Allergic rhinitis, unspecified: Secondary | ICD-10-CM | POA: Diagnosis not present

## 2020-12-11 DIAGNOSIS — C50919 Malignant neoplasm of unspecified site of unspecified female breast: Secondary | ICD-10-CM | POA: Diagnosis not present

## 2020-12-11 DIAGNOSIS — M6281 Muscle weakness (generalized): Secondary | ICD-10-CM | POA: Diagnosis not present

## 2020-12-13 ENCOUNTER — Inpatient Hospital Stay: Payer: Medicare HMO | Attending: Oncology | Admitting: Oncology

## 2020-12-13 ENCOUNTER — Telehealth: Payer: Self-pay | Admitting: Oncology

## 2020-12-13 VITALS — BP 146/64 | HR 114 | Temp 98.7°F | Resp 18 | Ht 60.0 in

## 2020-12-13 DIAGNOSIS — C50311 Malignant neoplasm of lower-inner quadrant of right female breast: Secondary | ICD-10-CM

## 2020-12-13 DIAGNOSIS — Z170 Estrogen receptor positive status [ER+]: Secondary | ICD-10-CM

## 2020-12-13 NOTE — Telephone Encounter (Signed)
Per 12/8 LOS, patient scheduled for April 2023 Appt.  Gave patient Appt Summary

## 2020-12-14 DIAGNOSIS — M6281 Muscle weakness (generalized): Secondary | ICD-10-CM | POA: Diagnosis not present

## 2020-12-14 DIAGNOSIS — E782 Mixed hyperlipidemia: Secondary | ICD-10-CM | POA: Diagnosis not present

## 2020-12-14 DIAGNOSIS — J309 Allergic rhinitis, unspecified: Secondary | ICD-10-CM | POA: Diagnosis not present

## 2020-12-14 DIAGNOSIS — R1310 Dysphagia, unspecified: Secondary | ICD-10-CM | POA: Diagnosis not present

## 2020-12-14 DIAGNOSIS — G809 Cerebral palsy, unspecified: Secondary | ICD-10-CM | POA: Diagnosis not present

## 2020-12-14 DIAGNOSIS — M810 Age-related osteoporosis without current pathological fracture: Secondary | ICD-10-CM | POA: Diagnosis not present

## 2020-12-17 DIAGNOSIS — J309 Allergic rhinitis, unspecified: Secondary | ICD-10-CM | POA: Diagnosis not present

## 2020-12-17 DIAGNOSIS — E782 Mixed hyperlipidemia: Secondary | ICD-10-CM | POA: Diagnosis not present

## 2020-12-17 DIAGNOSIS — G809 Cerebral palsy, unspecified: Secondary | ICD-10-CM | POA: Diagnosis not present

## 2020-12-17 DIAGNOSIS — R1310 Dysphagia, unspecified: Secondary | ICD-10-CM | POA: Diagnosis not present

## 2020-12-17 DIAGNOSIS — M810 Age-related osteoporosis without current pathological fracture: Secondary | ICD-10-CM | POA: Diagnosis not present

## 2020-12-17 DIAGNOSIS — M6281 Muscle weakness (generalized): Secondary | ICD-10-CM | POA: Diagnosis not present

## 2020-12-19 DIAGNOSIS — L300 Nummular dermatitis: Secondary | ICD-10-CM | POA: Diagnosis not present

## 2020-12-19 DIAGNOSIS — L299 Pruritus, unspecified: Secondary | ICD-10-CM | POA: Diagnosis not present

## 2020-12-20 DIAGNOSIS — R1310 Dysphagia, unspecified: Secondary | ICD-10-CM | POA: Diagnosis not present

## 2020-12-20 DIAGNOSIS — J309 Allergic rhinitis, unspecified: Secondary | ICD-10-CM | POA: Diagnosis not present

## 2020-12-20 DIAGNOSIS — E782 Mixed hyperlipidemia: Secondary | ICD-10-CM | POA: Diagnosis not present

## 2020-12-20 DIAGNOSIS — M6281 Muscle weakness (generalized): Secondary | ICD-10-CM | POA: Diagnosis not present

## 2020-12-20 DIAGNOSIS — G809 Cerebral palsy, unspecified: Secondary | ICD-10-CM | POA: Diagnosis not present

## 2020-12-20 DIAGNOSIS — M810 Age-related osteoporosis without current pathological fracture: Secondary | ICD-10-CM | POA: Diagnosis not present

## 2020-12-21 ENCOUNTER — Other Ambulatory Visit: Payer: Medicare HMO

## 2020-12-21 ENCOUNTER — Telehealth: Payer: Self-pay | Admitting: Oncology

## 2020-12-21 DIAGNOSIS — J309 Allergic rhinitis, unspecified: Secondary | ICD-10-CM | POA: Diagnosis not present

## 2020-12-21 DIAGNOSIS — M6281 Muscle weakness (generalized): Secondary | ICD-10-CM | POA: Diagnosis not present

## 2020-12-21 DIAGNOSIS — E782 Mixed hyperlipidemia: Secondary | ICD-10-CM | POA: Diagnosis not present

## 2020-12-21 DIAGNOSIS — G809 Cerebral palsy, unspecified: Secondary | ICD-10-CM | POA: Diagnosis not present

## 2020-12-21 DIAGNOSIS — M810 Age-related osteoporosis without current pathological fracture: Secondary | ICD-10-CM | POA: Diagnosis not present

## 2020-12-21 DIAGNOSIS — R1310 Dysphagia, unspecified: Secondary | ICD-10-CM | POA: Diagnosis not present

## 2020-12-21 NOTE — Telephone Encounter (Signed)
Per Jackelyn Poling, patient's son requesting Mammo, Bone Density to be performed at [facility] not [facility].  Informed Debbie she would need to reach out to Dr Leitha Schuller office regarding Mammo - She will get Dr Lynn Ito order the DEXA here

## 2020-12-24 DIAGNOSIS — E782 Mixed hyperlipidemia: Secondary | ICD-10-CM | POA: Diagnosis not present

## 2020-12-24 DIAGNOSIS — R1310 Dysphagia, unspecified: Secondary | ICD-10-CM | POA: Diagnosis not present

## 2020-12-24 DIAGNOSIS — M810 Age-related osteoporosis without current pathological fracture: Secondary | ICD-10-CM | POA: Diagnosis not present

## 2020-12-24 DIAGNOSIS — J309 Allergic rhinitis, unspecified: Secondary | ICD-10-CM | POA: Diagnosis not present

## 2020-12-24 DIAGNOSIS — M6281 Muscle weakness (generalized): Secondary | ICD-10-CM | POA: Diagnosis not present

## 2020-12-24 DIAGNOSIS — G809 Cerebral palsy, unspecified: Secondary | ICD-10-CM | POA: Diagnosis not present

## 2020-12-25 DIAGNOSIS — M6281 Muscle weakness (generalized): Secondary | ICD-10-CM | POA: Diagnosis not present

## 2020-12-25 DIAGNOSIS — G809 Cerebral palsy, unspecified: Secondary | ICD-10-CM | POA: Diagnosis not present

## 2020-12-25 DIAGNOSIS — R1310 Dysphagia, unspecified: Secondary | ICD-10-CM | POA: Diagnosis not present

## 2020-12-25 DIAGNOSIS — E782 Mixed hyperlipidemia: Secondary | ICD-10-CM | POA: Diagnosis not present

## 2020-12-25 DIAGNOSIS — M810 Age-related osteoporosis without current pathological fracture: Secondary | ICD-10-CM | POA: Diagnosis not present

## 2020-12-25 DIAGNOSIS — J309 Allergic rhinitis, unspecified: Secondary | ICD-10-CM | POA: Diagnosis not present

## 2020-12-26 DIAGNOSIS — E782 Mixed hyperlipidemia: Secondary | ICD-10-CM | POA: Diagnosis not present

## 2020-12-26 DIAGNOSIS — G809 Cerebral palsy, unspecified: Secondary | ICD-10-CM | POA: Diagnosis not present

## 2020-12-26 DIAGNOSIS — R1310 Dysphagia, unspecified: Secondary | ICD-10-CM | POA: Diagnosis not present

## 2020-12-26 DIAGNOSIS — J309 Allergic rhinitis, unspecified: Secondary | ICD-10-CM | POA: Diagnosis not present

## 2020-12-26 DIAGNOSIS — M6281 Muscle weakness (generalized): Secondary | ICD-10-CM | POA: Diagnosis not present

## 2020-12-26 DIAGNOSIS — M810 Age-related osteoporosis without current pathological fracture: Secondary | ICD-10-CM | POA: Diagnosis not present

## 2021-01-02 DIAGNOSIS — K219 Gastro-esophageal reflux disease without esophagitis: Secondary | ICD-10-CM | POA: Diagnosis not present

## 2021-01-02 DIAGNOSIS — K5900 Constipation, unspecified: Secondary | ICD-10-CM | POA: Diagnosis not present

## 2021-01-02 DIAGNOSIS — E785 Hyperlipidemia, unspecified: Secondary | ICD-10-CM | POA: Diagnosis not present

## 2021-01-02 DIAGNOSIS — M810 Age-related osteoporosis without current pathological fracture: Secondary | ICD-10-CM | POA: Diagnosis not present

## 2021-01-03 DIAGNOSIS — E782 Mixed hyperlipidemia: Secondary | ICD-10-CM | POA: Diagnosis not present

## 2021-01-03 DIAGNOSIS — R54 Age-related physical debility: Secondary | ICD-10-CM | POA: Diagnosis not present

## 2021-01-03 DIAGNOSIS — G809 Cerebral palsy, unspecified: Secondary | ICD-10-CM | POA: Diagnosis not present

## 2021-01-03 DIAGNOSIS — C50411 Malignant neoplasm of upper-outer quadrant of right female breast: Secondary | ICD-10-CM | POA: Diagnosis not present

## 2021-01-03 DIAGNOSIS — I10 Essential (primary) hypertension: Secondary | ICD-10-CM | POA: Diagnosis not present

## 2021-01-08 DIAGNOSIS — E782 Mixed hyperlipidemia: Secondary | ICD-10-CM | POA: Diagnosis not present

## 2021-01-08 DIAGNOSIS — R54 Age-related physical debility: Secondary | ICD-10-CM | POA: Diagnosis not present

## 2021-01-08 DIAGNOSIS — I10 Essential (primary) hypertension: Secondary | ICD-10-CM | POA: Diagnosis not present

## 2021-01-08 DIAGNOSIS — C50411 Malignant neoplasm of upper-outer quadrant of right female breast: Secondary | ICD-10-CM | POA: Diagnosis not present

## 2021-01-08 DIAGNOSIS — G809 Cerebral palsy, unspecified: Secondary | ICD-10-CM | POA: Diagnosis not present

## 2021-02-03 DIAGNOSIS — M2550 Pain in unspecified joint: Secondary | ICD-10-CM | POA: Diagnosis not present

## 2021-02-03 DIAGNOSIS — G809 Cerebral palsy, unspecified: Secondary | ICD-10-CM | POA: Diagnosis not present

## 2021-02-03 DIAGNOSIS — C50411 Malignant neoplasm of upper-outer quadrant of right female breast: Secondary | ICD-10-CM | POA: Diagnosis not present

## 2021-02-03 DIAGNOSIS — R5381 Other malaise: Secondary | ICD-10-CM | POA: Diagnosis not present

## 2021-04-10 NOTE — Progress Notes (Signed)
?[facility]  ?[address] ?[place],  [place]  [address] ?[phone] ? ?Clinic Day:  04/11/2021 ? ?Referring physician: Mayra Neer, MD ? ?HISTORY OF PRESENT ILLNESS:  ?The patient is a 66 y.o. female with multifocal stage IB (T2 N0 M0) hormone positive breast cancer, status post a right mastectomy in October 2022.  As Oncotype testing showed a low risk of distant breast cancer recurrence, the patient has been receiving anastrozole for her adjuvant endocrine therapy.  She comes in today for routine follow-up.  Since her last visit, the patient has been doing well.  She denies having any changes with her left breast or right chest wall which concern her for early disease recurrence ? ?PHYSICAL EXAM:  ?Blood pressure 136/60, pulse (!) 103, temperature 99 ?F (37.2 ?C), resp. rate 18, height 5' (1.524 m), SpO2 96 %. ?Wt Readings from Last 3 Encounters:  ?11/08/20 160 lb (72.6 kg)  ? ?Body mass index is 31.25 kg/m?Marland Kitchen ?Performance status (ECOG): 1 - Symptomatic but completely ambulatory ?Physical Exam ?Constitutional:   ?   Appearance: Normal appearance.  ?HENT:  ?   Mouth/Throat:  ?   Pharynx: Oropharynx is clear. No oropharyngeal exudate.  ?Cardiovascular:  ?   Rate and Rhythm: Normal rate and regular rhythm.  ?   Heart sounds: No murmur heard. ?  No friction rub. No gallop.  ?Pulmonary:  ?   Breath sounds: Normal breath sounds.  ?Chest:  ?Breasts: ?   Right: Absent. No swelling, bleeding, inverted nipple, mass, nipple discharge or skin change.  ?   Left: Normal. No swelling, bleeding, inverted nipple, mass, nipple discharge or skin change.  ?Abdominal:  ?   General: Bowel sounds are normal. There is no distension.  ?   Palpations: Abdomen is soft. There is no mass.  ?   Tenderness: There is no abdominal tenderness.  ?Musculoskeletal:     ?   General: No tenderness.  ?   Cervical back: Normal range of motion and neck supple.  ?   Right lower leg: No edema.  ?   Left lower leg: No  edema.  ?Lymphadenopathy:  ?   Cervical: No cervical adenopathy.  ?   Right cervical: No superficial, deep or posterior cervical adenopathy. ?   Left cervical: No superficial, deep or posterior cervical adenopathy.  ?   Upper Body:  ?   Right upper body: No supraclavicular or axillary adenopathy.  ?   Left upper body: No supraclavicular or axillary adenopathy.  ?   Lower Body: No right inguinal adenopathy. No left inguinal adenopathy.  ?Skin: ?   Coloration: Skin is not jaundiced.  ?   Findings: No lesion or rash.  ?Neurological:  ?   General: No focal deficit present.  ?   Mental Status: She is alert and oriented to person, place, and time. Mental status is at baseline.  ?Psychiatric:     ?   Mood and Affect: Mood normal.     ?   Behavior: Behavior normal.     ?   Thought Content: Thought content normal.     ?   Judgment: Judgment normal.  ? ? ?ASSESSMENT & PLAN:  ?A 66 y.o. female with multifocal stage IB (T2 N0 M0) hormone positive breast cancer.  Based upon her clinical breast exam today, the patient remains disease-free.  She knows to continue taking anastrozole on a daily basis for 5 years of adjuvant endocrine therapy.  As she is doing well,  I will see her back in 4 months for her next clinical breast exam.  The patient understands all the plans discussed today and is in agreement with them. ? ?Adonia Porada Macarthur Critchley, MD ? ? ? ?  ? ?

## 2021-04-11 ENCOUNTER — Ambulatory Visit: Payer: Medicare HMO | Admitting: Oncology

## 2021-04-11 ENCOUNTER — Inpatient Hospital Stay: Payer: Medicare Other | Attending: Oncology | Admitting: Oncology

## 2021-04-11 VITALS — BP 136/60 | HR 103 | Temp 99.0°F | Resp 18 | Ht 60.0 in

## 2021-04-11 DIAGNOSIS — Z170 Estrogen receptor positive status [ER+]: Secondary | ICD-10-CM | POA: Diagnosis not present

## 2021-04-11 DIAGNOSIS — C50111 Malignant neoplasm of central portion of right female breast: Secondary | ICD-10-CM

## 2021-04-12 ENCOUNTER — Ambulatory Visit: Payer: Medicare HMO | Admitting: Oncology

## 2021-05-29 ENCOUNTER — Encounter: Payer: Self-pay | Admitting: Oncology

## 2021-08-12 ENCOUNTER — Ambulatory Visit: Payer: Medicare Other | Admitting: Oncology

## 2021-08-15 NOTE — Progress Notes (Signed)
[facility]  [address] [place],  [place]  [address] [phone]  Clinic Day:  08/16/2021  Referring physician: No ref. provider found  HISTORY OF PRESENT ILLNESS:  The patient is a 66 y.o. female with multifocal stage IB (T2 N0 M0) hormone positive breast cancer, status post a right mastectomy in October 2022.  As Oncotype testing showed a low risk of distant breast cancer recurrence, the patient has been taking nastrozole for her adjuvant endocrine therapy.  She comes in today for routine follow-up.  Since her last visit, the patient has been doing well.  She denies having any changes with her left breast or right chest wall which concern her for early disease recurrence  PHYSICAL EXAM:  Blood pressure (!) 164/72, pulse 95, temperature 98.8 F (37.1 C), resp. rate 16, height 5' (1.524 m), SpO2 96 %. Wt Readings from Last 3 Encounters:  11/08/20 160 lb (72.6 kg)   Body mass index is 31.25 kg/m. Performance status (ECOG): 1 - Symptomatic but completely ambulatory Physical Exam Constitutional:      Appearance: Normal appearance.  HENT:     Mouth/Throat:     Pharynx: Oropharynx is clear. No oropharyngeal exudate.  Cardiovascular:     Rate and Rhythm: Normal rate and regular rhythm.     Heart sounds: No murmur heard.    No friction rub. No gallop.  Pulmonary:     Breath sounds: Normal breath sounds.  Chest:  Breasts:    Right: Absent. No swelling, bleeding, inverted nipple, mass, nipple discharge or skin change.     Left: Normal. No swelling, bleeding, inverted nipple, mass, nipple discharge or skin change.  Abdominal:     General: Bowel sounds are normal. There is no distension.     Palpations: Abdomen is soft. There is no mass.     Tenderness: There is no abdominal tenderness.  Musculoskeletal:        General: No tenderness.     Cervical back: Normal range of motion and neck supple.     Right lower leg: No edema.     Left lower leg: No  edema.  Lymphadenopathy:     Cervical: No cervical adenopathy.     Right cervical: No superficial, deep or posterior cervical adenopathy.    Left cervical: No superficial, deep or posterior cervical adenopathy.     Upper Body:     Right upper body: No supraclavicular or axillary adenopathy.     Left upper body: No supraclavicular or axillary adenopathy.     Lower Body: No right inguinal adenopathy. No left inguinal adenopathy.  Skin:    Coloration: Skin is not jaundiced.     Findings: No lesion or rash.  Neurological:     General: No focal deficit present.     Mental Status: She is alert and oriented to person, place, and time. Mental status is at baseline.  Psychiatric:        Mood and Affect: Mood normal.        Behavior: Behavior normal.        Thought Content: Thought content normal.        Judgment: Judgment normal.    ASSESSMENT & PLAN:  A 66 y.o. female with multifocal stage IB (T2 N0 M0) hormone positive breast cancer.  Based upon her clinical breast exam today, the patient remains disease-free.  She knows to continue taking anastrozole on a daily basis for 5 years of adjuvant endocrine therapy.  As she is  doing well, I will see her back in 4 months for her next clinical breast exam.  Her annual mammogram will be scheduled before her next visit for her continued radiographic breast cancer surveillance.  The patient understands all the plans discussed today and is in agreement with them.  Marrianne Sica Macarthur Critchley, MD

## 2021-08-16 ENCOUNTER — Inpatient Hospital Stay: Payer: Medicare Other | Attending: Oncology | Admitting: Oncology

## 2021-08-16 ENCOUNTER — Other Ambulatory Visit: Payer: Self-pay | Admitting: Oncology

## 2021-08-16 VITALS — BP 164/72 | HR 95 | Temp 98.8°F | Resp 16 | Ht 60.0 in

## 2021-08-16 DIAGNOSIS — Z170 Estrogen receptor positive status [ER+]: Secondary | ICD-10-CM

## 2021-08-16 DIAGNOSIS — C50411 Malignant neoplasm of upper-outer quadrant of right female breast: Secondary | ICD-10-CM

## 2021-09-02 ENCOUNTER — Ambulatory Visit: Payer: Medicare HMO

## 2021-10-17 IMAGING — MG MM DIGITAL SCREENING BILAT W/ TOMO AND CAD
8 series · 8 of 24 positions shown · non-contrast
Comparison: Previous exam(s).

CLINICAL DATA: Screening.

EXAM:
DIGITAL SCREENING BILATERAL MAMMOGRAM WITH TOMOSYNTHESIS AND CAD
TECHNIQUE: Bilateral screening digital craniocaudal and mediolateral oblique
mammograms were obtained. Bilateral screening digital breast
tomosynthesis was performed. The images were evaluated with
computer-aided detection.

[R CC synth-2D]
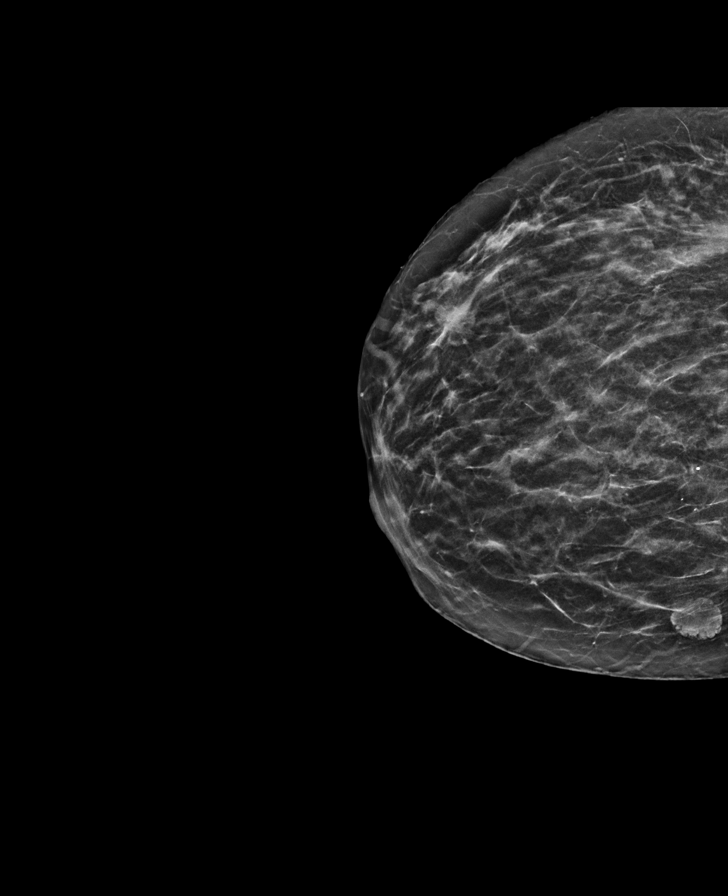

[L MLO synth-2D]
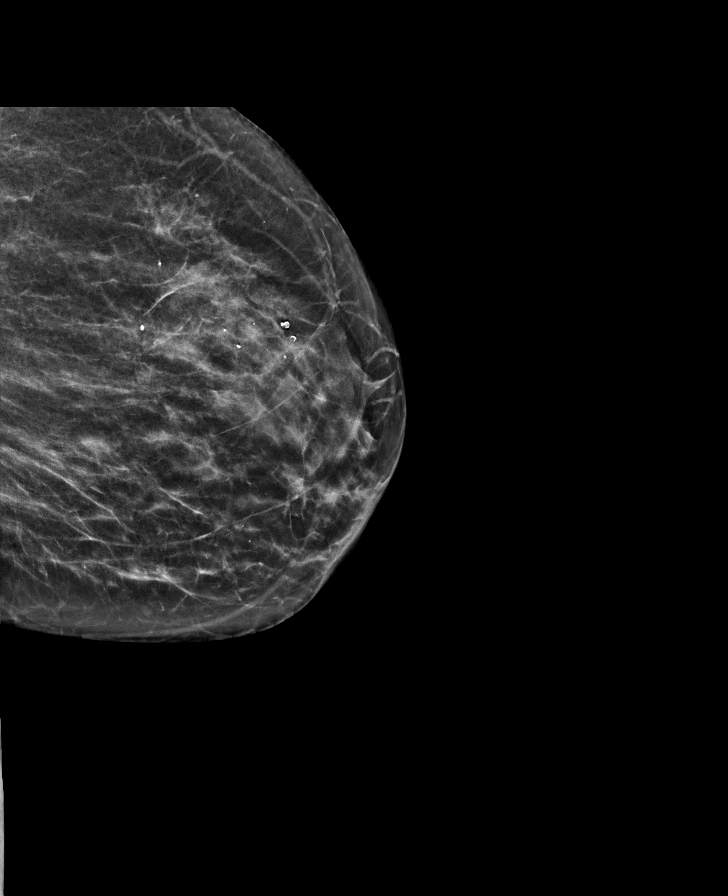

[R MLO synth-2D]
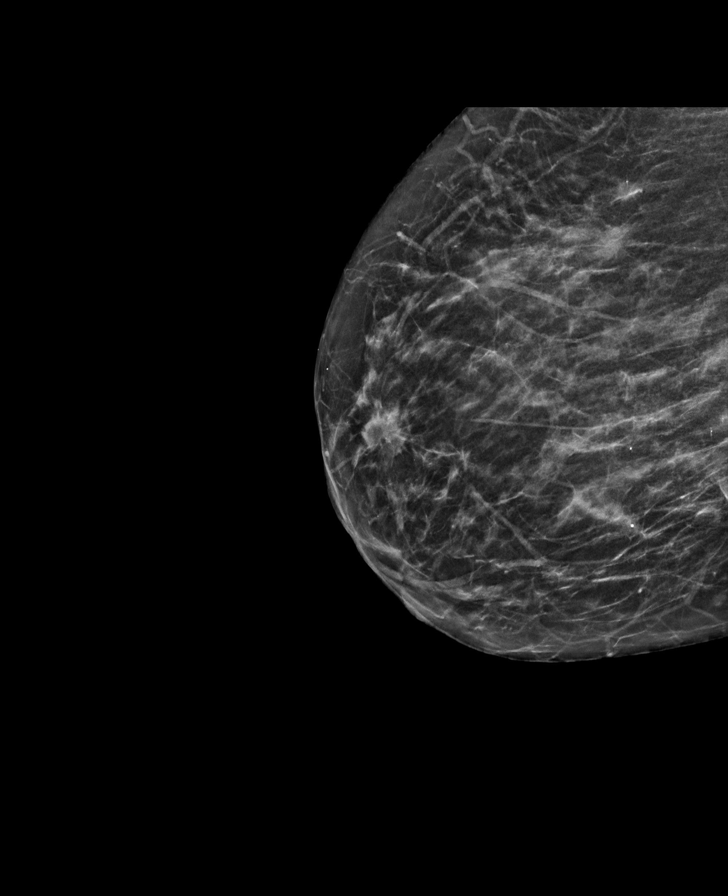

[L CC synth-2D]
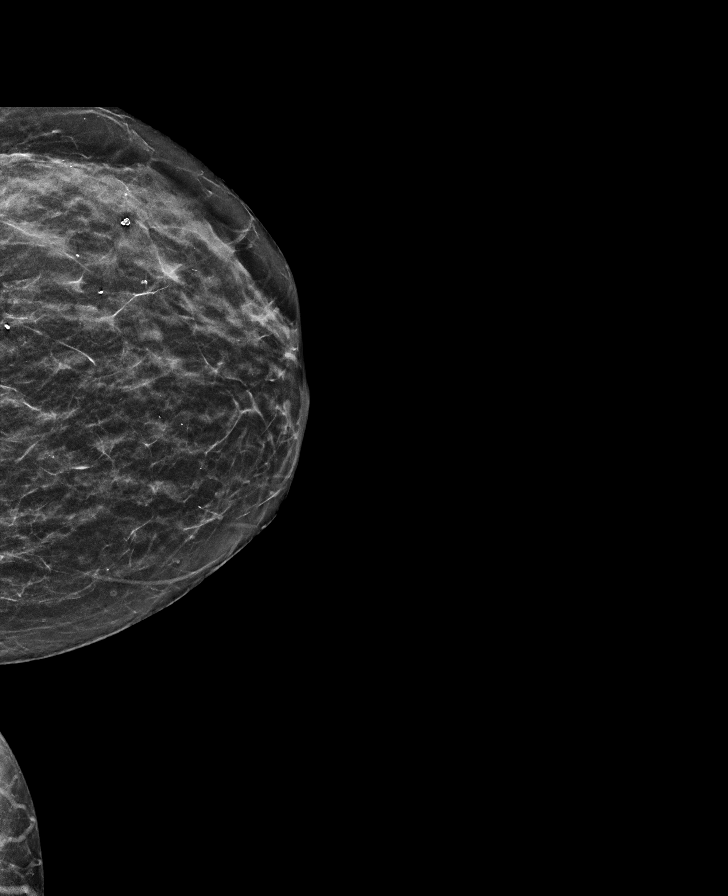

[R MLO tomo · tomo slice 27/52.0]
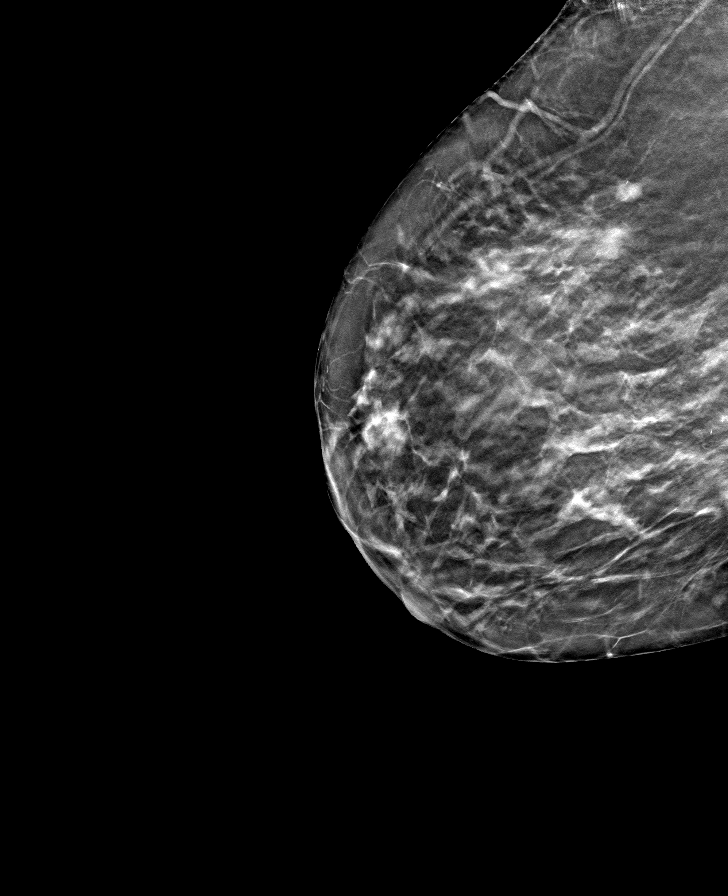

[L MLO tomo · tomo slice 29/58.0]
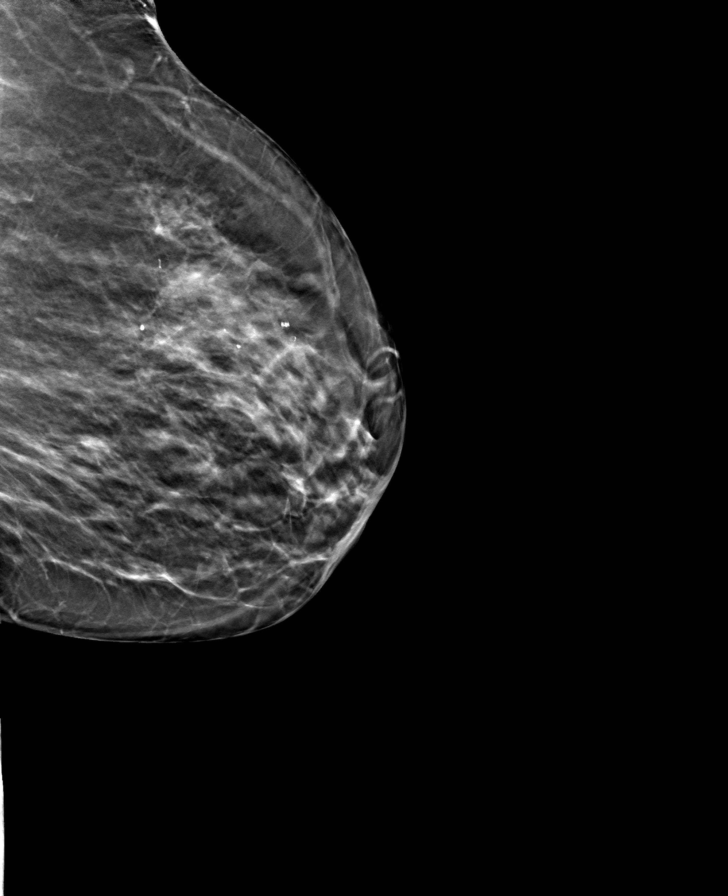

[L CC tomo · tomo slice 25/48.0]
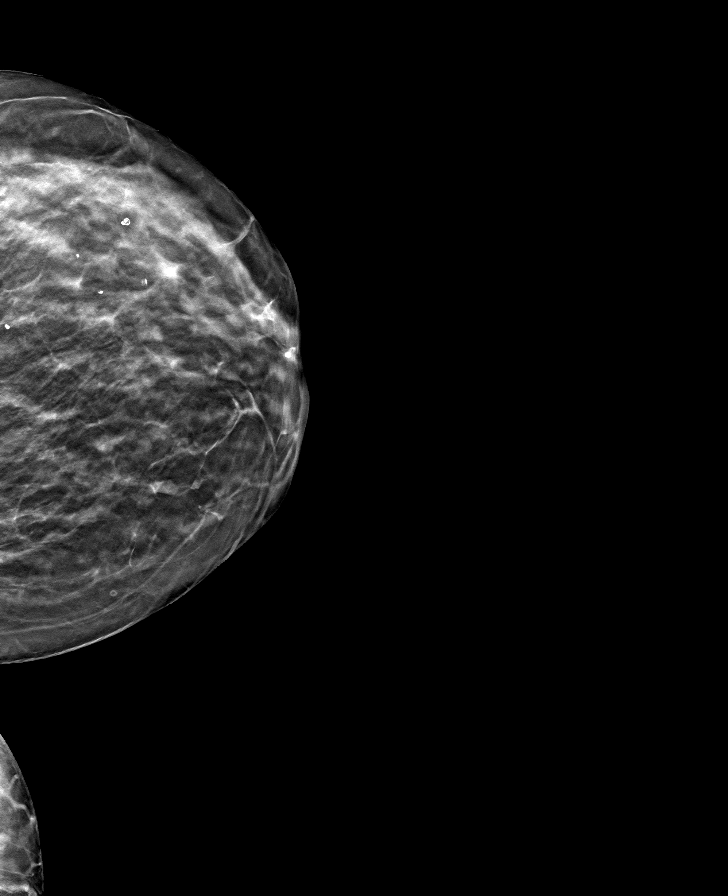

[R CC tomo · tomo slice 24/47.0]
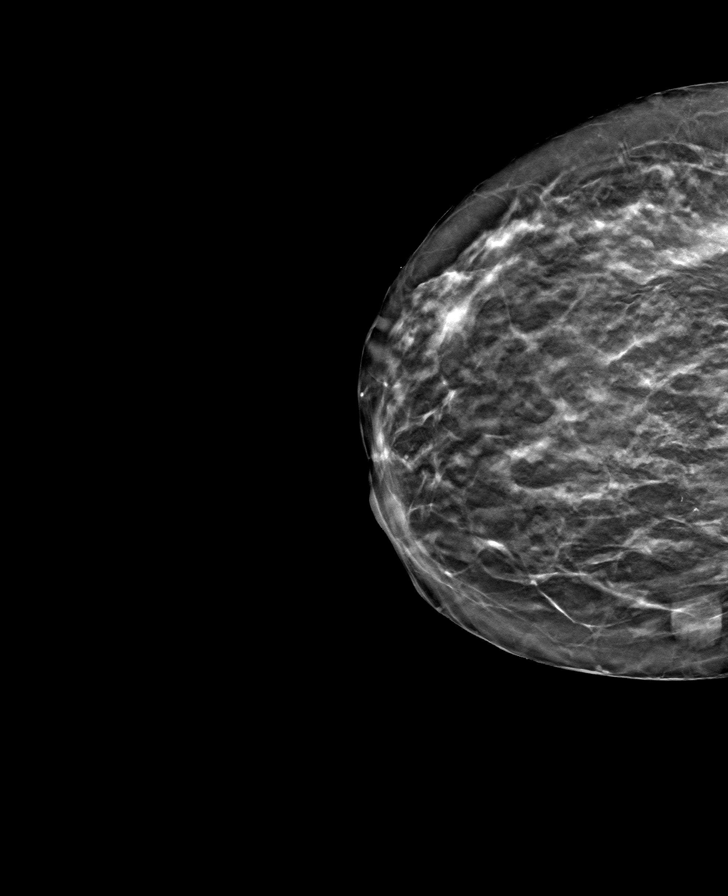

[8 of 24 positions shown; findings below may reference images not displayed]

ACR Breast Density Category c: The breast tissue is heterogeneously
dense, which may obscure small masses.
FINDINGS: In the right breast, 2 possible masses warrants further evaluation.
In the left breast, no findings suspicious for malignancy.
IMPRESSION: Further evaluation is suggested for 2 possible masses in the right
breast.

RECOMMENDATION:
Diagnostic mammogram and possibly ultrasound of the right breast.
(Code:[id number]-[id number])

The patient will be contacted regarding the findings, and additional
imaging will be scheduled.

BI-RADS CATEGORY  0: Incomplete. Need additional imaging evaluation
and/or prior mammograms for comparison.

## 2021-12-17 NOTE — Progress Notes (Incomplete)
[facility]  [address] [place],  [place]  [address] [phone]  Clinic Day:  08/16/2021  Referring physician: Annamarie Major, MD  HISTORY OF PRESENT ILLNESS:  The patient is a 66 y.o. female with multifocal stage IB (T2 N0 M0) hormone positive breast cancer, status post a right mastectomy in October 2022.  As Oncotype testing showed a low risk of distant breast cancer recurrence, the patient has been taking nastrozole for her adjuvant endocrine therapy.  She comes in today for routine follow-up.  Since her last visit, the patient has been doing well.  She denies having any changes with her left breast or right chest wall which concern her for early disease recurrence.  Of note, her annual mammogram in September 2023 showed no evidence of disease recurrence.  PHYSICAL EXAM:  There were no vitals taken for this visit. Wt Readings from Last 3 Encounters:  11/08/20 160 lb (72.6 kg)   There is no height or weight on file to calculate BMI. Performance status (ECOG): 1 - Symptomatic but completely ambulatory Physical Exam Constitutional:      Appearance: Normal appearance.  HENT:     Mouth/Throat:     Pharynx: Oropharynx is clear. No oropharyngeal exudate.  Cardiovascular:     Rate and Rhythm: Normal rate and regular rhythm.     Heart sounds: No murmur heard.    No friction rub. No gallop.  Pulmonary:     Breath sounds: Normal breath sounds.  Chest:  Breasts:    Right: Absent. No swelling, bleeding, inverted nipple, mass, nipple discharge or skin change.     Left: Normal. No swelling, bleeding, inverted nipple, mass, nipple discharge or skin change.  Abdominal:     General: Bowel sounds are normal. There is no distension.     Palpations: Abdomen is soft. There is no mass.     Tenderness: There is no abdominal tenderness.  Musculoskeletal:        General: No tenderness.     Cervical back: Normal range of motion and neck supple.     Right lower  leg: No edema.     Left lower leg: No edema.  Lymphadenopathy:     Cervical: No cervical adenopathy.     Right cervical: No superficial, deep or posterior cervical adenopathy.    Left cervical: No superficial, deep or posterior cervical adenopathy.     Upper Body:     Right upper body: No supraclavicular or axillary adenopathy.     Left upper body: No supraclavicular or axillary adenopathy.     Lower Body: No right inguinal adenopathy. No left inguinal adenopathy.  Skin:    Coloration: Skin is not jaundiced.     Findings: No lesion or rash.  Neurological:     General: No focal deficit present.     Mental Status: She is alert and oriented to person, place, and time. Mental status is at baseline.  Psychiatric:        Mood and Affect: Mood normal.        Behavior: Behavior normal.        Thought Content: Thought content normal.        Judgment: Judgment normal.   ASSESSMENT & PLAN:  A 66 y.o. female with multifocal stage IB (T2 N0 M0) hormone positive breast cancer.  Based upon her clinical breast exam today, the patient remains disease-free.  She knows to continue taking anastrozole on a daily basis for 5 years of adjuvant endocrine  therapy.  As she is doing well, I will see her back in 4 months for her next clinical breast exam.  Her annual mammogram will be scheduled before her next visit for her continued radiographic breast cancer surveillance.  The patient understands all the plans discussed today and is in agreement with them.  Marshia Tropea Macarthur Critchley, MD

## 2021-12-18 ENCOUNTER — Inpatient Hospital Stay: Payer: Medicare Other | Admitting: Oncology

## 2021-12-25 ENCOUNTER — Telehealth: Payer: Self-pay | Admitting: Oncology

## 2021-12-25 ENCOUNTER — Inpatient Hospital Stay: Payer: Medicare Other | Attending: Oncology | Admitting: Oncology

## 2021-12-25 VITALS — BP 123/63 | HR 109 | Temp 99.1°F | Resp 18 | Ht 60.0 in

## 2021-12-25 DIAGNOSIS — C50111 Malignant neoplasm of central portion of right female breast: Secondary | ICD-10-CM | POA: Diagnosis not present

## 2021-12-25 DIAGNOSIS — Z170 Estrogen receptor positive status [ER+]: Secondary | ICD-10-CM

## 2021-12-25 NOTE — Telephone Encounter (Signed)
12/25/21 Next appt scheduled and confirmed with patient 

## 2021-12-25 NOTE — Progress Notes (Signed)
[facility]  [address] [place],  [place]  [address] [phone]  Clinic Day:  12/25/2021  Referring physician: Annamarie Major, MD  HISTORY OF PRESENT ILLNESS:  The patient is a 66 y.o. female with multifocal stage IB (T2 N0 M0) hormone positive breast cancer, status post a right mastectomy in October 2022.  As Oncotype testing showed a low risk of distant breast cancer recurrence, the patient has been taking anastrozole for her adjuvant endocrine therapy.  She comes in today for routine follow-up.  Since her last visit, the patient has been doing well.  She denies having any changes with her left breast or right chest wall which concern her for early disease recurrence.  Of note, her mammogram in September 2023 showed no evidence of disease recurrence.   PHYSICAL EXAM:  Blood pressure 123/63, pulse (!) 109, temperature 99.1 F (37.3 C), resp. rate 18, height 5' (1.524 m), SpO2 98 %. Wt Readings from Last 3 Encounters:  11/08/20 160 lb (72.6 kg)   Body mass index is 31.25 kg/m. Performance status (ECOG): 1 - Symptomatic but completely ambulatory Physical Exam Constitutional:      Appearance: Normal appearance.  HENT:     Mouth/Throat:     Pharynx: Oropharynx is clear. No oropharyngeal exudate.  Cardiovascular:     Rate and Rhythm: Normal rate and regular rhythm.     Heart sounds: No murmur heard.    No friction rub. No gallop.  Pulmonary:     Breath sounds: Normal breath sounds.  Chest:  Breasts:    Right: Absent. No swelling, bleeding, inverted nipple, mass, nipple discharge or skin change.     Left: Normal. No swelling, bleeding, inverted nipple, mass, nipple discharge or skin change.  Abdominal:     General: Bowel sounds are normal. There is no distension.     Palpations: Abdomen is soft. There is no mass.     Tenderness: There is no abdominal tenderness.  Musculoskeletal:        General: No tenderness.     Cervical back: Normal  range of motion and neck supple.     Right lower leg: No edema.     Left lower leg: No edema.  Lymphadenopathy:     Cervical: No cervical adenopathy.     Right cervical: No superficial, deep or posterior cervical adenopathy.    Left cervical: No superficial, deep or posterior cervical adenopathy.     Upper Body:     Right upper body: No supraclavicular or axillary adenopathy.     Left upper body: No supraclavicular or axillary adenopathy.     Lower Body: No right inguinal adenopathy. No left inguinal adenopathy.  Skin:    Coloration: Skin is not jaundiced.     Findings: No lesion or rash.  Neurological:     General: No focal deficit present.     Mental Status: She is alert and oriented to person, place, and time. Mental status is at baseline.  Psychiatric:        Mood and Affect: Mood normal.        Behavior: Behavior normal.        Thought Content: Thought content normal.        Judgment: Judgment normal.    ASSESSMENT & PLAN:  A 66 y.o. female with multifocal stage IB (T2 N0 M0) hormone positive breast cancer.  Based upon her clinical breast exam today and recent mammogram, the patient remains disease-free.  She knows to continue  taking anastrozole on a daily basis for 5 years of adjuvant endocrine therapy.  As she is doing well, I will see her back in 4 months for her next clinical breast exam.  The patient understands all the plans discussed today and is in agreement with them.  Ayona Yniguez Macarthur Critchley, MD

## 2022-04-24 NOTE — Progress Notes (Signed)
[facility]  [address] [place],  [place]  [address] [phone]  Clinic Day:  04/25/2022  Referring physician: Maisie Fus, MD  HISTORY OF PRESENT ILLNESS:  The patient is a 67 y.o. female with multifocal stage IB (T2 N0 M0) hormone positive breast cancer, status post a right mastectomy in October 2022.  As Oncotype testing showed a low risk of distant breast cancer recurrence, the patient has been taking anastrozole for her adjuvant endocrine therapy.  She comes in today for routine follow-up.  Since her last visit, the patient has been doing well.  She denies having any changes with her left breast or right chest wall which concern her for early disease recurrence.    PHYSICAL EXAM:  Blood pressure (!) 171/61, pulse (!) 115, temperature 97.9 F (36.6 C), resp. rate 16, height 5' (1.524 m), SpO2 96 %. Wt Readings from Last 3 Encounters:  11/08/20 160 lb (72.6 kg)   Body mass index is 31.25 kg/m. Performance status (ECOG): 1 - Symptomatic but completely ambulatory Physical Exam Constitutional:      Appearance: Normal appearance.  HENT:     Mouth/Throat:     Pharynx: Oropharynx is clear. No oropharyngeal exudate.  Cardiovascular:     Rate and Rhythm: Normal rate and regular rhythm.     Heart sounds: No murmur heard.    No friction rub. No gallop.  Pulmonary:     Breath sounds: Normal breath sounds.  Chest:  Breasts:    Right: Absent. No swelling, bleeding, inverted nipple, mass, nipple discharge or skin change.     Left: Normal. No swelling, bleeding, inverted nipple, mass, nipple discharge or skin change.  Abdominal:     General: Bowel sounds are normal. There is no distension.     Palpations: Abdomen is soft. There is no mass.     Tenderness: There is no abdominal tenderness.  Musculoskeletal:        General: No tenderness.     Cervical back: Normal range of motion and neck supple.     Right lower leg: No edema.     Left lower  leg: No edema.  Lymphadenopathy:     Cervical: No cervical adenopathy.     Right cervical: No superficial, deep or posterior cervical adenopathy.    Left cervical: No superficial, deep or posterior cervical adenopathy.     Upper Body:     Right upper body: No supraclavicular or axillary adenopathy.     Left upper body: No supraclavicular or axillary adenopathy.     Lower Body: No right inguinal adenopathy. No left inguinal adenopathy.  Skin:    Coloration: Skin is not jaundiced.     Findings: No lesion or rash.  Neurological:     General: No focal deficit present.     Mental Status: She is alert and oriented to person, place, and time. Mental status is at baseline.  Psychiatric:        Mood and Affect: Mood normal.        Behavior: Behavior normal.        Thought Content: Thought content normal.        Judgment: Judgment normal.    ASSESSMENT & PLAN:  A 67 y.o. female with multifocal stage IB (T2 N0 M0) hormone positive breast cancer.  Based upon her clinical breast exam today, the patient remains disease-free.  She knows to continue taking her anastrozole on a daily basis for 5 years of adjuvant endocrine therapy.  As she is doing well, I will see her back in 4 months for her next clinical breast exam.  The patient understands all the plans discussed today and is in agreement with them.  Jimmye Wisnieski Kirby Funk, MD

## 2022-04-25 ENCOUNTER — Inpatient Hospital Stay: Payer: Medicare Other | Attending: Oncology | Admitting: Oncology

## 2022-04-25 ENCOUNTER — Telehealth: Payer: Self-pay | Admitting: Oncology

## 2022-04-25 VITALS — BP 171/61 | HR 115 | Temp 97.9°F | Resp 16 | Ht 60.0 in

## 2022-04-25 DIAGNOSIS — Z170 Estrogen receptor positive status [ER+]: Secondary | ICD-10-CM

## 2022-04-25 DIAGNOSIS — C50211 Malignant neoplasm of upper-inner quadrant of right female breast: Secondary | ICD-10-CM | POA: Diagnosis not present

## 2022-04-25 DIAGNOSIS — C50121 Malignant neoplasm of central portion of right male breast: Secondary | ICD-10-CM

## 2022-04-25 NOTE — Telephone Encounter (Signed)
04/25/22 Next appt scheduled and confirmed with patient 

## 2022-05-21 ENCOUNTER — Encounter: Payer: Self-pay | Admitting: Genetic Counselor

## 2022-08-22 ENCOUNTER — Ambulatory Visit: Payer: Medicare Other | Admitting: Oncology

## 2022-08-25 NOTE — Progress Notes (Unsigned)
[facility]  [address] [place],  [place]  [address] [phone]  Clinic Day:  08/26/2022  Referring physician: Maisie Fus, MD  HISTORY OF PRESENT ILLNESS:  The patient is a 67 y.o. female with multifocal stage IB (T2 N0 M0) hormone positive breast cancer, status post a right mastectomy in October 2022.  As Oncotype testing showed a low risk of distant breast cancer recurrence, the patient has been taking anastrozole for her adjuvant endocrine therapy.  She comes in today for routine follow-up.  Since her last visit, the patient has been doing well.  She denies having any changes with her left breast or right chest wall which concern her for early disease recurrence.    PHYSICAL EXAM:  Blood pressure 136/68, pulse (!) 110, temperature 99.5 F (37.5 C), resp. rate 18, height 5' (1.524 m), SpO2 95%. Wt Readings from Last 3 Encounters:  11/08/20 160 lb (72.6 kg)   Body mass index is 31.25 kg/m. Performance status (ECOG): 1 - Symptomatic but completely ambulatory Physical Exam Constitutional:      Appearance: Normal appearance.  HENT:     Mouth/Throat:     Pharynx: Oropharynx is clear. No oropharyngeal exudate.  Cardiovascular:     Rate and Rhythm: Normal rate and regular rhythm.     Heart sounds: No murmur heard.    No friction rub. No gallop.  Pulmonary:     Breath sounds: Normal breath sounds.  Chest:  Breasts:    Right: Absent. No swelling, bleeding, inverted nipple, mass, nipple discharge or skin change.     Left: Normal. No swelling, bleeding, inverted nipple, mass, nipple discharge or skin change.  Abdominal:     General: Bowel sounds are normal. There is no distension.     Palpations: Abdomen is soft. There is no mass.     Tenderness: There is no abdominal tenderness.  Musculoskeletal:        General: No tenderness.     Cervical back: Normal range of motion and neck supple.     Right lower leg: No edema.     Left lower leg: No  edema.  Lymphadenopathy:     Cervical: No cervical adenopathy.     Right cervical: No superficial, deep or posterior cervical adenopathy.    Left cervical: No superficial, deep or posterior cervical adenopathy.     Upper Body:     Right upper body: No supraclavicular or axillary adenopathy.     Left upper body: No supraclavicular or axillary adenopathy.     Lower Body: No right inguinal adenopathy. No left inguinal adenopathy.  Skin:    Coloration: Skin is not jaundiced.     Findings: No lesion or rash.  Neurological:     General: No focal deficit present.     Mental Status: She is alert and oriented to person, place, and time. Mental status is at baseline.  Psychiatric:        Mood and Affect: Mood normal.        Behavior: Behavior normal.        Thought Content: Thought content normal.        Judgment: Judgment normal.    ASSESSMENT & PLAN:  A 67 y.o. female with multifocal stage IB (T2 N0 M0) hormone positive breast cancer.  Based upon her clinical breast exam today, the patient remains disease-free.  She knows to continue taking her anastrozole on a daily basis for 5 years of adjuvant endocrine therapy.  As she  is doing well, I will see her back in 6 months for her next clinical breast exam.  Her annual mammogram will be scheduled before her next visit for her continued radiographic breast cancer surveillance.  The patient understands all the plans discussed today and is in agreement with them.  Thi Klich Kirby Funk, MD

## 2022-08-26 ENCOUNTER — Other Ambulatory Visit: Payer: Self-pay | Admitting: Oncology

## 2022-08-26 ENCOUNTER — Inpatient Hospital Stay: Payer: Medicare Other | Attending: Oncology | Admitting: Oncology

## 2022-08-26 VITALS — BP 136/68 | HR 110 | Temp 99.5°F | Resp 18 | Ht 60.0 in

## 2022-08-26 DIAGNOSIS — C50111 Malignant neoplasm of central portion of right female breast: Secondary | ICD-10-CM | POA: Diagnosis not present

## 2022-08-26 DIAGNOSIS — C50411 Malignant neoplasm of upper-outer quadrant of right female breast: Secondary | ICD-10-CM

## 2022-08-26 DIAGNOSIS — Z170 Estrogen receptor positive status [ER+]: Secondary | ICD-10-CM | POA: Diagnosis not present

## 2022-08-27 ENCOUNTER — Telehealth: Payer: Self-pay | Admitting: Oncology

## 2022-08-27 NOTE — Telephone Encounter (Signed)
Contacted pt to schedule an appt. Unable to reach via phone, voicemail was left.    Scheduling Message Entered by Rennis Harding A on 08/26/2022 at 12:05 PM Priority: Routine <No visit type provided>  Department: [facility]  Provider:  Scheduling Notes:  Appt 02-26-23

## 2022-09-26 ENCOUNTER — Other Ambulatory Visit: Payer: Self-pay

## 2022-11-17 ENCOUNTER — Encounter: Payer: Self-pay | Admitting: Surgery

## 2023-02-26 ENCOUNTER — Inpatient Hospital Stay: Payer: Medicare Other | Admitting: Oncology

## 2023-02-26 NOTE — Progress Notes (Unsigned)
[facility]  [address] [place],  [place]  [address] [phone]  Clinic Day:  08/26/2022  Referring physician: Maisie Fus, MD  HISTORY OF PRESENT ILLNESS:  The patient is a 68 y.o. female with multifocal stage IB (T2 N0 M0) hormone positive breast cancer, status post a right mastectomy in October 2022.  As Oncotype testing showed a low risk of distant breast cancer recurrence, the patient has been taking anastrozole for her adjuvant endocrine therapy.  She comes in today for routine follow-up.  Since her last visit, the patient has been doing well.  She denies having any changes with her left breast or right chest wall which concern her for early disease recurrence.  Of note a left breast ultrasound done in October 2024 showed no evidence of disease recurrence.  PHYSICAL EXAM:  There were no vitals taken for this visit. Wt Readings from Last 3 Encounters:  11/08/20 160 lb (72.6 kg)   There is no height or weight on file to calculate BMI. Performance status (ECOG): 1 - Symptomatic but completely ambulatory Physical Exam Constitutional:      Appearance: Normal appearance.  HENT:     Mouth/Throat:     Pharynx: Oropharynx is clear. No oropharyngeal exudate.  Cardiovascular:     Rate and Rhythm: Normal rate and regular rhythm.     Heart sounds: No murmur heard.    No friction rub. No gallop.  Pulmonary:     Breath sounds: Normal breath sounds.  Chest:  Breasts:    Right: Absent. No swelling, bleeding, inverted nipple, mass, nipple discharge or skin change.     Left: Normal. No swelling, bleeding, inverted nipple, mass, nipple discharge or skin change.  Abdominal:     General: Bowel sounds are normal. There is no distension.     Palpations: Abdomen is soft. There is no mass.     Tenderness: There is no abdominal tenderness.  Musculoskeletal:        General: No tenderness.     Cervical back: Normal range of motion and neck supple.     Right  lower leg: No edema.     Left lower leg: No edema.  Lymphadenopathy:     Cervical: No cervical adenopathy.     Right cervical: No superficial, deep or posterior cervical adenopathy.    Left cervical: No superficial, deep or posterior cervical adenopathy.     Upper Body:     Right upper body: No supraclavicular or axillary adenopathy.     Left upper body: No supraclavicular or axillary adenopathy.     Lower Body: No right inguinal adenopathy. No left inguinal adenopathy.  Skin:    Coloration: Skin is not jaundiced.     Findings: No lesion or rash.  Neurological:     General: No focal deficit present.     Mental Status: She is alert and oriented to person, place, and time. Mental status is at baseline.  Psychiatric:        Mood and Affect: Mood normal.        Behavior: Behavior normal.        Thought Content: Thought content normal.        Judgment: Judgment normal.    ASSESSMENT & PLAN:  A 68 y.o. female with multifocal stage IB (T2 N0 M0) hormone positive breast cancer.  Based upon her clinical breast exam today, the patient remains disease-free.  She knows to continue taking her anastrozole on a daily basis for 5  years of adjuvant endocrine therapy.  As she is doing well, I will see her back in 6 months for her next clinical breast exam.  Her annual mammogram will be scheduled before her next visit for her continued radiographic breast cancer surveillance.  The patient understands all the plans discussed today and is in agreement with them.  Halimah Bewick Kirby Funk, MD

## 2023-02-27 ENCOUNTER — Inpatient Hospital Stay: Payer: Medicare Other | Attending: Oncology | Admitting: Oncology

## 2023-02-27 VITALS — BP 117/77 | HR 100 | Temp 97.7°F | Resp 20 | Ht 60.0 in

## 2023-02-27 DIAGNOSIS — Z170 Estrogen receptor positive status [ER+]: Secondary | ICD-10-CM | POA: Diagnosis not present

## 2023-02-27 DIAGNOSIS — Z923 Personal history of irradiation: Secondary | ICD-10-CM | POA: Insufficient documentation

## 2023-02-27 DIAGNOSIS — C50912 Malignant neoplasm of unspecified site of left female breast: Secondary | ICD-10-CM | POA: Diagnosis present

## 2023-02-27 DIAGNOSIS — Z79811 Long term (current) use of aromatase inhibitors: Secondary | ICD-10-CM | POA: Diagnosis not present

## 2023-02-27 DIAGNOSIS — C50111 Malignant neoplasm of central portion of right female breast: Secondary | ICD-10-CM | POA: Diagnosis not present

## 2023-02-27 DIAGNOSIS — C9012 Plasma cell leukemia in relapse: Secondary | ICD-10-CM | POA: Diagnosis not present
